# Patient Record
Sex: Female | Born: 1990
Health system: Southern US, Community
[De-identification: ages and names within clinical notes are randomized; demographics above are authoritative.]

## PROBLEM LIST (undated history)

## (undated) DIAGNOSIS — G4700 Insomnia, unspecified: Secondary | ICD-10-CM

## (undated) DIAGNOSIS — J45909 Unspecified asthma, uncomplicated: Secondary | ICD-10-CM

## (undated) DIAGNOSIS — T783XXA Angioneurotic edema, initial encounter: Secondary | ICD-10-CM

## (undated) DIAGNOSIS — F419 Anxiety disorder, unspecified: Secondary | ICD-10-CM

## (undated) DIAGNOSIS — T7840XA Allergy, unspecified, initial encounter: Secondary | ICD-10-CM

## (undated) DIAGNOSIS — F909 Attention-deficit hyperactivity disorder, unspecified type: Secondary | ICD-10-CM

## (undated) DIAGNOSIS — F319 Bipolar disorder, unspecified: Secondary | ICD-10-CM

## (undated) HISTORY — DX: Angioneurotic edema, initial encounter: T78.3XXA

## (undated) HISTORY — DX: Insomnia, unspecified: G47.00

## (undated) HISTORY — DX: Anxiety disorder, unspecified: F41.9

## (undated) HISTORY — DX: Bipolar disorder, unspecified: F31.9

## (undated) HISTORY — DX: Attention-deficit hyperactivity disorder, unspecified type: F90.9

## (undated) HISTORY — DX: Allergy, unspecified, initial encounter: T78.40XA

## (undated) HISTORY — DX: Unspecified asthma, uncomplicated: J45.909

## (undated) HISTORY — PX: NO PAST SURGERIES: SHX2092

---
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
## 2012-09-12 ENCOUNTER — Ambulatory Visit (HOSPITAL_COMMUNITY): Payer: Self-pay | Admitting: Psychiatry

## 2012-10-28 ENCOUNTER — Ambulatory Visit (HOSPITAL_COMMUNITY): Payer: Self-pay | Admitting: Psychiatry

## 2016-03-19 DIAGNOSIS — Z114 Encounter for screening for human immunodeficiency virus [HIV]: Secondary | ICD-10-CM | POA: Diagnosis not present

## 2016-03-19 DIAGNOSIS — R5381 Other malaise: Secondary | ICD-10-CM | POA: Diagnosis not present

## 2016-03-19 DIAGNOSIS — R0602 Shortness of breath: Secondary | ICD-10-CM | POA: Diagnosis not present

## 2016-03-19 DIAGNOSIS — F319 Bipolar disorder, unspecified: Secondary | ICD-10-CM | POA: Diagnosis not present

## 2016-03-19 DIAGNOSIS — Z79899 Other long term (current) drug therapy: Secondary | ICD-10-CM | POA: Diagnosis not present

## 2016-03-19 DIAGNOSIS — E559 Vitamin D deficiency, unspecified: Secondary | ICD-10-CM | POA: Diagnosis not present

## 2016-03-19 DIAGNOSIS — Z0000 Encounter for general adult medical examination without abnormal findings: Secondary | ICD-10-CM | POA: Diagnosis not present

## 2016-04-02 DIAGNOSIS — Z6831 Body mass index (BMI) 31.0-31.9, adult: Secondary | ICD-10-CM | POA: Diagnosis not present

## 2016-04-02 DIAGNOSIS — Z01419 Encounter for gynecological examination (general) (routine) without abnormal findings: Secondary | ICD-10-CM | POA: Diagnosis not present

## 2016-04-02 DIAGNOSIS — Z124 Encounter for screening for malignant neoplasm of cervix: Secondary | ICD-10-CM | POA: Diagnosis not present

## 2016-04-03 DIAGNOSIS — E559 Vitamin D deficiency, unspecified: Secondary | ICD-10-CM | POA: Diagnosis not present

## 2016-04-03 DIAGNOSIS — R0602 Shortness of breath: Secondary | ICD-10-CM | POA: Diagnosis not present

## 2016-04-03 DIAGNOSIS — F419 Anxiety disorder, unspecified: Secondary | ICD-10-CM | POA: Diagnosis not present

## 2016-04-03 DIAGNOSIS — R635 Abnormal weight gain: Secondary | ICD-10-CM | POA: Diagnosis not present

## 2016-04-17 DIAGNOSIS — E559 Vitamin D deficiency, unspecified: Secondary | ICD-10-CM | POA: Diagnosis not present

## 2016-04-17 DIAGNOSIS — Z79899 Other long term (current) drug therapy: Secondary | ICD-10-CM | POA: Diagnosis not present

## 2016-04-17 DIAGNOSIS — Z1389 Encounter for screening for other disorder: Secondary | ICD-10-CM | POA: Diagnosis not present

## 2016-04-17 DIAGNOSIS — R635 Abnormal weight gain: Secondary | ICD-10-CM | POA: Diagnosis not present

## 2016-04-20 DIAGNOSIS — N750 Cyst of Bartholin's gland: Secondary | ICD-10-CM | POA: Diagnosis not present

## 2016-04-20 DIAGNOSIS — Z6830 Body mass index (BMI) 30.0-30.9, adult: Secondary | ICD-10-CM | POA: Diagnosis not present

## 2016-05-03 DIAGNOSIS — Z6829 Body mass index (BMI) 29.0-29.9, adult: Secondary | ICD-10-CM | POA: Diagnosis not present

## 2016-05-03 DIAGNOSIS — N750 Cyst of Bartholin's gland: Secondary | ICD-10-CM | POA: Diagnosis not present

## 2016-05-17 DIAGNOSIS — N750 Cyst of Bartholin's gland: Secondary | ICD-10-CM | POA: Diagnosis not present

## 2016-05-18 DIAGNOSIS — F419 Anxiety disorder, unspecified: Secondary | ICD-10-CM | POA: Diagnosis not present

## 2016-05-18 DIAGNOSIS — R635 Abnormal weight gain: Secondary | ICD-10-CM | POA: Diagnosis not present

## 2016-05-18 DIAGNOSIS — J309 Allergic rhinitis, unspecified: Secondary | ICD-10-CM | POA: Diagnosis not present

## 2016-05-18 DIAGNOSIS — Z79899 Other long term (current) drug therapy: Secondary | ICD-10-CM | POA: Diagnosis not present

## 2016-06-06 DIAGNOSIS — J4540 Moderate persistent asthma, uncomplicated: Secondary | ICD-10-CM | POA: Diagnosis not present

## 2016-06-06 DIAGNOSIS — F319 Bipolar disorder, unspecified: Secondary | ICD-10-CM | POA: Diagnosis not present

## 2016-06-06 DIAGNOSIS — E559 Vitamin D deficiency, unspecified: Secondary | ICD-10-CM | POA: Diagnosis not present

## 2016-06-06 DIAGNOSIS — T7840XA Allergy, unspecified, initial encounter: Secondary | ICD-10-CM | POA: Diagnosis not present

## 2016-06-07 DIAGNOSIS — Z202 Contact with and (suspected) exposure to infections with a predominantly sexual mode of transmission: Secondary | ICD-10-CM | POA: Diagnosis not present

## 2016-06-07 DIAGNOSIS — Z6828 Body mass index (BMI) 28.0-28.9, adult: Secondary | ICD-10-CM | POA: Diagnosis not present

## 2016-06-11 DIAGNOSIS — Z23 Encounter for immunization: Secondary | ICD-10-CM | POA: Diagnosis not present

## 2016-06-11 DIAGNOSIS — F319 Bipolar disorder, unspecified: Secondary | ICD-10-CM | POA: Diagnosis not present

## 2016-06-11 DIAGNOSIS — J4541 Moderate persistent asthma with (acute) exacerbation: Secondary | ICD-10-CM | POA: Diagnosis not present

## 2016-06-11 DIAGNOSIS — J069 Acute upper respiratory infection, unspecified: Secondary | ICD-10-CM | POA: Diagnosis not present

## 2016-09-10 DIAGNOSIS — F310 Bipolar disorder, current episode hypomanic: Secondary | ICD-10-CM | POA: Diagnosis not present

## 2016-09-13 DIAGNOSIS — F4323 Adjustment disorder with mixed anxiety and depressed mood: Secondary | ICD-10-CM | POA: Diagnosis not present

## 2016-09-13 DIAGNOSIS — F3111 Bipolar disorder, current episode manic without psychotic features, mild: Secondary | ICD-10-CM | POA: Diagnosis not present

## 2016-09-20 DIAGNOSIS — F4323 Adjustment disorder with mixed anxiety and depressed mood: Secondary | ICD-10-CM | POA: Diagnosis not present

## 2016-09-27 DIAGNOSIS — F4323 Adjustment disorder with mixed anxiety and depressed mood: Secondary | ICD-10-CM | POA: Diagnosis not present

## 2016-10-02 DIAGNOSIS — F319 Bipolar disorder, unspecified: Secondary | ICD-10-CM | POA: Diagnosis not present

## 2016-11-01 DIAGNOSIS — Z113 Encounter for screening for infections with a predominantly sexual mode of transmission: Secondary | ICD-10-CM | POA: Diagnosis not present

## 2016-11-01 DIAGNOSIS — Z6828 Body mass index (BMI) 28.0-28.9, adult: Secondary | ICD-10-CM | POA: Diagnosis not present

## 2016-11-05 DIAGNOSIS — F3176 Bipolar disorder, in full remission, most recent episode depressed: Secondary | ICD-10-CM | POA: Diagnosis not present

## 2016-11-05 DIAGNOSIS — F900 Attention-deficit hyperactivity disorder, predominantly inattentive type: Secondary | ICD-10-CM | POA: Diagnosis not present

## 2016-11-22 DIAGNOSIS — Z30433 Encounter for removal and reinsertion of intrauterine contraceptive device: Secondary | ICD-10-CM | POA: Diagnosis not present

## 2016-11-22 DIAGNOSIS — Z3202 Encounter for pregnancy test, result negative: Secondary | ICD-10-CM | POA: Diagnosis not present

## 2016-11-22 DIAGNOSIS — R21 Rash and other nonspecific skin eruption: Secondary | ICD-10-CM | POA: Diagnosis not present

## 2016-11-22 DIAGNOSIS — Z6828 Body mass index (BMI) 28.0-28.9, adult: Secondary | ICD-10-CM | POA: Diagnosis not present

## 2016-11-22 DIAGNOSIS — F319 Bipolar disorder, unspecified: Secondary | ICD-10-CM | POA: Diagnosis not present

## 2016-11-22 DIAGNOSIS — E010 Iodine-deficiency related diffuse (endemic) goiter: Secondary | ICD-10-CM | POA: Diagnosis not present

## 2016-11-22 DIAGNOSIS — J4541 Moderate persistent asthma with (acute) exacerbation: Secondary | ICD-10-CM | POA: Diagnosis not present

## 2016-11-23 ENCOUNTER — Other Ambulatory Visit: Payer: Self-pay | Admitting: Family Medicine

## 2016-11-23 DIAGNOSIS — E010 Iodine-deficiency related diffuse (endemic) goiter: Secondary | ICD-10-CM

## 2016-11-29 DIAGNOSIS — L02229 Furuncle of trunk, unspecified: Secondary | ICD-10-CM | POA: Diagnosis not present

## 2016-11-29 DIAGNOSIS — B9689 Other specified bacterial agents as the cause of diseases classified elsewhere: Secondary | ICD-10-CM | POA: Diagnosis not present

## 2016-12-03 ENCOUNTER — Ambulatory Visit
Admission: RE | Admit: 2016-12-03 | Discharge: 2016-12-03 | Disposition: A | Discharge status: Home / Self Care | Payer: BLUE CROSS/BLUE SHIELD | Source: Ambulatory Visit | Attending: Family Medicine | Admitting: Family Medicine

## 2016-12-03 DIAGNOSIS — E010 Iodine-deficiency related diffuse (endemic) goiter: Secondary | ICD-10-CM | POA: Diagnosis not present

## 2016-12-07 DIAGNOSIS — F319 Bipolar disorder, unspecified: Secondary | ICD-10-CM | POA: Diagnosis not present

## 2016-12-07 DIAGNOSIS — J4541 Moderate persistent asthma with (acute) exacerbation: Secondary | ICD-10-CM | POA: Diagnosis not present

## 2016-12-07 DIAGNOSIS — J309 Allergic rhinitis, unspecified: Secondary | ICD-10-CM | POA: Diagnosis not present

## 2016-12-18 DIAGNOSIS — F4325 Adjustment disorder with mixed disturbance of emotions and conduct: Secondary | ICD-10-CM | POA: Diagnosis not present

## 2016-12-20 ENCOUNTER — Ambulatory Visit (INDEPENDENT_AMBULATORY_CARE_PROVIDER_SITE_OTHER): Payer: BLUE CROSS/BLUE SHIELD | Admitting: Allergy

## 2016-12-20 ENCOUNTER — Encounter: Payer: Self-pay | Admitting: Allergy

## 2016-12-20 VITALS — BP 100/64 | HR 99 | Temp 98.7°F | Resp 17 | Ht 64.0 in | Wt 163.2 lb

## 2016-12-20 DIAGNOSIS — L508 Other urticaria: Secondary | ICD-10-CM

## 2016-12-20 DIAGNOSIS — J4530 Mild persistent asthma, uncomplicated: Secondary | ICD-10-CM

## 2016-12-20 DIAGNOSIS — H1010 Acute atopic conjunctivitis, unspecified eye: Secondary | ICD-10-CM | POA: Diagnosis not present

## 2016-12-20 DIAGNOSIS — J309 Allergic rhinitis, unspecified: Secondary | ICD-10-CM

## 2016-12-20 MED ORDER — METHYLPREDNISOLONE ACETATE 80 MG/ML IJ SUSP
80.0000 mg | Freq: Once | INTRAMUSCULAR | Status: AC
Start: 2016-12-20 — End: 2016-12-20
  Administered 2016-12-20: 80 mg via INTRAMUSCULAR

## 2016-12-20 MED ORDER — RANITIDINE HCL 150 MG PO TABS: 150.0000 mg | ORAL_TABLET | Freq: Two times a day (BID) | ORAL | 2 refills | Status: DC

## 2016-12-20 MED ORDER — TRIAMCINOLONE ACETONIDE 0.1 % EX OINT: 1.0000 "application " | TOPICAL_OINTMENT | Freq: Two times a day (BID) | CUTANEOUS | 2 refills | Status: DC

## 2016-12-20 NOTE — Patient Instructions (Addendum)
Itchy rash  Rash of unclear etiology at this time.  Rash does appear to be Hives/urticaria but does not appear to be consistent with contact dermatitis or atopic dermatitis.  A prescription has been provided for triamcinolone 0.1% cream sparingly to affected areas twice daily as needed.  Also recommend taking your Allegra 1 tab twice a day as well as Zantac '150mg'$  twice a day.  Continue your singulair daily.    Instructions have been provided and discussed for H1/H2 receptor blockade with step-wise increase/decrease to find lowest effective dose (as above).  Will obtain labs to work-up for potential underlying cause: will get labs from Dr. Jodi Mourning office get additional -- tryptase, ESR, chronic hive panel, environmental panel  Asthma  Use Proair 2 puffs 15-20 minutes prior to activity and as needed every 4-6 hours for cough/wheeze/shortness of breath/chest tightness  Continue Flovent 2 puffs twice a day  Continue Singulair daily Asthma control goals:  Full participation in all desired activities (may need albuterol before activity) Albuterol use two time or less a week on average (not counting use with activity) Cough interfering with sleep two time or less a month Oral steroids no more than once a year No hospitalizations  Allergic rhinoconjunctivitis  Take allegra as above  Recommend nasal steroid spray like Flonase 1-2 sprays each nostril for nasal congestion/drainage.    Will obtain environmental allergen panel as above  Follow-up 2-3 months

## 2016-12-20 NOTE — Progress Notes (Signed)
New Patient Note  RE: Claudia Winters MRN: [id number] DOB: [birth year] Date of Office Visit: 12/20/2016  Referring provider: No ref. provider found Primary care provider: Vernie Shanks, MD  Chief Complaint: rash  History of present illness: Claudia Winters is a 26 y.o. female presenting today for evaluation of itchy rash.   She started getting a rash that would come and go over a couple hours and she reports the rash comes on anytime the doors at her job are opened to the outside.  She feels she is allergic to an outdoor allergen.   The rash now has been ongoing for about past month.  She reports the rash occurs daily.  She has the rash on her arms, back, chest, legs and feet.  Rash is extremely itchy and she reports she is miserable throughout the day due to the itch.  She scratches to the point of breaking the skin which then leaves scars.  No associated swelling, joint aches/pains.  She has been seen by her PCP and dermatologist which during those visits she reports she did not have any rash and thus nothing was done for her.  She states the dermatologist told her she had dry skin.  She denies any new foods, new medications, stings, changes in soaps/lotions/detergents.  Denies any preceding illnesses.  She reports she has always had sensitive skin and having 'reactions to outdoors'.  She reports she was taking up to 6 benadryls a day, topical benadryl, OTC hydrocortisone which has not been helping.       She has history of asthma and she does take flovent daily and takes Proair prior to her activity.   She takes singulair as well.  She denies any nighttime awakenings. She denies any oral steroid use in the past year any hospitalizations.    She has had allergy testing done in 2015 and recalls being positive for "dogwood trees" when she was living in [place].  She reports she would avoid the outdoors.  She was recently prescribed Allegra to use 1 tab daily for control of allergy symptoms during  changes of season. She reports she would get symptoms consistent with "sinus infection" for visit congestion and sinus pain and pressure. She also reports having ocular symptoms of itchy watery eyes.      Review of systems: Review of Systems  Constitutional: Negative for chills, fever and malaise/fatigue.  HENT: Negative for congestion, ear discharge, ear pain, nosebleeds, sinus pain, sore throat and tinnitus.   Eyes: Negative for discharge and redness.  Respiratory: Negative for cough, shortness of breath and wheezing.   Cardiovascular: Negative for chest pain.  Gastrointestinal: Negative for abdominal pain, diarrhea, heartburn, nausea and vomiting.  Musculoskeletal: Negative for joint pain and myalgias.  Skin: Positive for itching and rash.  Neurological: Negative for headaches.    All other systems negative unless noted above in HPI  Past medical history: Past Medical History:  Diagnosis Date  . Asthma     Past surgical history: Past Surgical History:  Procedure Laterality Date  . NO PAST SURGERIES      Family history:  Family History  Problem Relation Age of Onset  . Allergic rhinitis Neg Hx   . Angioedema Neg Hx   . Asthma Neg Hx   . Atopy Neg Hx   . Eczema Neg Hx   . Immunodeficiency Neg Hx   . Urticaria Neg Hx     Social history: She lives in a home with carpeting with electric heating  and central cooling. There is a dog and cat in the home. There is no concern for water damage, mildew or roaches in the home. She works as a Aeronautical engineer. She does smoke cigarettes socially.   Medication List: Allergies as of 12/20/2016   Not on File     Medication List       Accurate as of 12/20/16  7:20 PM. Always use your most recent med list.          ADDERALL XR 20 MG 24 hr capsule Generic drug:  amphetamine-dextroamphetamine Adderall XR 20 mg capsule,extended release   buPROPion 300 MG 24 hr tablet Commonly known as:  WELLBUTRIN XL bupropion HCl XL 300 mg  24 hr tablet, extended release   clonazePAM 1 MG tablet Commonly known as:  KLONOPIN clonazepam 1 mg tablet   FLOVENT HFA 110 MCG/ACT inhaler Generic drug:  fluticasone Flovent HFA 110 mcg/actuation aerosol inhaler   KYLEENA 19.5 MG Iud Generic drug:  Levonorgestrel Kyleena 17.5 mcg/24 hour (5 years) intrauterine device  Take 1 device by intrauterine route.   lithium carbonate 450 MG CR tablet Commonly known as:  ESKALITH lithium carbonate ER 450 mg tablet,extended release   ranitidine 150 MG tablet Commonly known as:  ZANTAC Take 1 tablet (150 mg total) by mouth 2 (two) times daily.   SINGULAIR 10 MG tablet Generic drug:  montelukast Singulair 10 mg tablet  Take 1 tablet every day by oral route.   triamcinolone ointment 0.1 % Commonly known as:  KENALOG Apply 1 application topically 2 (two) times daily.       Known medication allergies: Not on File   Physical examination: Blood pressure 100/64, pulse 99, temperature 98.7 F (37.1 C), temperature source Oral, resp. rate 17, height 5' 4" (1.626 m), weight 163 lb 3.2 oz (74 kg), SpO2 97 %.  General: Alert, interactive, in no acute distress. HEENT: PERRLA, TMs pearly gray, turbinates minimally edematous without discharge, post-pharynx non erythematous. Neck: Supple without lymphadenopathy. Lungs: Clear to auscultation without wheezing, rhonchi or rales. {no increased work of breathing. CV: Normal S1, S2 without murmurs. Abdomen: Nondistended, nontender. Skin: Scattered erythematous urticarial type lesions primarily located Arms bilaterally, chest, back, legs bilaterally.  Several lesions are excoriated , nonvesicular. Extremities:  No clubbing, cyanosis or edema. Neuro:   Grossly intact.  Diagnositics/Labs: Labs: She did have recent labs done by her PCP which she was able to pull up on her phone for review with TSH 1.78, T4 8 0.8 both normal. CMP was unremarkable and CBC unremarkable except for eosinophil of  8.4  Allergy testing: Deferred due to ongoing rash  Assessment and plan:   Itchy rash  Rash of unclear etiology at this time.  Rash does appear to be Hives/urticaria but does not appear to be consistent with contact dermatitis or atopic dermatitis.  A prescription has been provided for triamcinolone 0.1% cream sparingly to affected areas twice daily as needed.  Also recommend taking your Allegra 1 tab twice a day as well as Zantac 164m twice a day.  Continue your singulair daily.    Instructions have been provided and discussed for H1/H2 receptor blockade with step-wise increase/decrease to find lowest effective dose (as above).  Will obtain labs to work-up for potential underlying cause: will get labs from Dr. WJodi Mourningoffice --get additional -- tryptase, ESR, chronic hive panel, environmental panel  Asthma  Use Proair 2 puffs 15-20 minutes prior to activity and as needed every 4-6 hours for cough/wheeze/shortness of breath/chest tightness  Continue Flovent 2 puffs twice a day  Continue Singulair daily Asthma control goals:  Full participation in all desired activities (may need albuterol before activity) Albuterol use two time or less a week on average (not counting use with activity) Cough interfering with sleep two time or less a month Oral steroids no more than once a year No hospitalizations  Allergic rhinoconjunctivitis  Take allegra as above  Recommend nasal steroid spray like Flonase 1-2 sprays each nostril for nasal congestion/drainage.    Will obtain environmental allergen panel as above  Follow-up 2-3 months   I appreciate the opportunity to take part in [facility] care. Please do not hesitate to contact me with questions.  Sincerely,   Prudy Feeler, MD Allergy/Immunology [facility]

## 2016-12-21 LAB — CBC WITH DIFFERENTIAL/PLATELET
Basophils Absolute: 0 cells/uL (ref 0–200)
Basophils Relative: 0 %
Eosinophils Absolute: 427 cells/uL (ref 15–500)
Eosinophils Relative: 7 %
HCT: 39.1 % (ref 35.0–45.0)
Hemoglobin: 12.6 g/dL (ref 11.7–15.5)
Lymphocytes Relative: 43 %
Lymphs Abs: 2623 cells/uL (ref 850–3900)
MCH: 28.5 pg (ref 27.0–33.0)
MCHC: 32.2 g/dL (ref 32.0–36.0)
MCV: 88.5 fL (ref 80.0–100.0)
MPV: 10.2 fL (ref 7.5–12.5)
Monocytes Absolute: 366 cells/uL (ref 200–950)
Monocytes Relative: 6 %
Neutro Abs: 2684 cells/uL (ref 1500–7800)
Neutrophils Relative %: 44 %
Platelets: 329 10*3/uL (ref 140–400)
RBC: 4.42 MIL/uL (ref 3.80–5.10)
RDW: 14.8 % (ref 11.0–15.0)
WBC: 6.1 10*3/uL (ref 3.8–10.8)

## 2016-12-22 DIAGNOSIS — F900 Attention-deficit hyperactivity disorder, predominantly inattentive type: Secondary | ICD-10-CM | POA: Diagnosis not present

## 2016-12-22 DIAGNOSIS — F3174 Bipolar disorder, in full remission, most recent episode manic: Secondary | ICD-10-CM | POA: Diagnosis not present

## 2016-12-22 DIAGNOSIS — F3176 Bipolar disorder, in full remission, most recent episode depressed: Secondary | ICD-10-CM | POA: Diagnosis not present

## 2016-12-22 LAB — SEDIMENTATION RATE: Sed Rate: 1 mm/hr (ref 0–20)

## 2016-12-24 LAB — CP584 ZONE 3
Allergen, A. alternata, m6: 6.09 kU/L — ABNORMAL HIGH
Allergen, Black Locust, Acacia9: <0.1 kU/L
Allergen, C. Herbarum, M2: 0.91 kU/L — ABNORMAL HIGH
Allergen, Cedar tree, t12: 0.18 kU/L — ABNORMAL HIGH
Allergen, Comm Silver Birch, t9: <0.1 kU/L
Allergen, D pternoyssinus,d7: <0.1 kU/L
Allergen, Mucor Racemosus, M4: <0.1 kU/L
Allergen, Mulberry, t76: <0.1 kU/L
Allergen, Oak,t7: <0.1 kU/L
Allergen, P. notatum, m1: 0.78 kU/L — ABNORMAL HIGH
Allergen, S. Botryosum, m10: 1.84 kU/L — ABNORMAL HIGH
Aspergillus fumigatus, m3: 0.45 kU/L — ABNORMAL HIGH
Bahia Grass: <0.1 kU/L
Bermuda Grass: <0.1 kU/L
Box Elder IgE: <0.1 kU/L
Cat Dander: <0.1 kU/L
Cockroach: 0.14 kU/L — ABNORMAL HIGH
Common Ragweed: <0.1 kU/L
D. farinae: 0.15 kU/L — ABNORMAL HIGH
Dog Dander: 0.15 kU/L — ABNORMAL HIGH
Elm IgE: <0.1 kU/L
Johnson Grass: <0.1 kU/L
Meadow Grass: <0.1 kU/L
Nettle: 0.11 kU/L — ABNORMAL HIGH
Pecan/Hickory Tree IgE: 0.57 kU/L — ABNORMAL HIGH
Plantain: <0.1 kU/L
Rough Pigweed  IgE: 0.34 kU/L — ABNORMAL HIGH

## 2016-12-25 LAB — TRYPTASE: Tryptase: 7.3 ug/L (ref <11)

## 2016-12-26 LAB — CP CHRONIC URTICARIA INDEX PANEL
Histamine Release: <16 % (ref <16)
TSH: 0.42 mIU/L
Thyroglobulin Ab: 3 IU/mL — ABNORMAL HIGH (ref <2)
Thyroperoxidase Ab SerPl-aCnc: <1 IU/mL (ref <9)

## 2017-01-10 DIAGNOSIS — F4325 Adjustment disorder with mixed disturbance of emotions and conduct: Secondary | ICD-10-CM | POA: Diagnosis not present

## 2017-01-15 DIAGNOSIS — F4325 Adjustment disorder with mixed disturbance of emotions and conduct: Secondary | ICD-10-CM | POA: Diagnosis not present

## 2017-01-28 ENCOUNTER — Ambulatory Visit: Payer: Self-pay | Admitting: Allergy and Immunology

## 2017-03-05 DIAGNOSIS — F4325 Adjustment disorder with mixed disturbance of emotions and conduct: Secondary | ICD-10-CM | POA: Diagnosis not present

## 2017-03-19 DIAGNOSIS — F4325 Adjustment disorder with mixed disturbance of emotions and conduct: Secondary | ICD-10-CM | POA: Diagnosis not present

## 2017-03-28 DIAGNOSIS — F4325 Adjustment disorder with mixed disturbance of emotions and conduct: Secondary | ICD-10-CM | POA: Diagnosis not present

## 2017-04-04 ENCOUNTER — Ambulatory Visit: Payer: BLUE CROSS/BLUE SHIELD | Admitting: Allergy & Immunology

## 2017-04-04 DIAGNOSIS — F4325 Adjustment disorder with mixed disturbance of emotions and conduct: Secondary | ICD-10-CM | POA: Diagnosis not present

## 2017-04-05 DIAGNOSIS — Z113 Encounter for screening for infections with a predominantly sexual mode of transmission: Secondary | ICD-10-CM | POA: Diagnosis not present

## 2017-04-05 DIAGNOSIS — Z6827 Body mass index (BMI) 27.0-27.9, adult: Secondary | ICD-10-CM | POA: Diagnosis not present

## 2017-04-05 DIAGNOSIS — Z01419 Encounter for gynecological examination (general) (routine) without abnormal findings: Secondary | ICD-10-CM | POA: Diagnosis not present

## 2017-04-18 ENCOUNTER — Ambulatory Visit (INDEPENDENT_AMBULATORY_CARE_PROVIDER_SITE_OTHER): Payer: BLUE CROSS/BLUE SHIELD | Admitting: Allergy & Immunology

## 2017-04-18 ENCOUNTER — Encounter: Payer: Self-pay | Admitting: Allergy & Immunology

## 2017-04-18 ENCOUNTER — Other Ambulatory Visit: Payer: Self-pay | Admitting: *Deleted

## 2017-04-18 ENCOUNTER — Ambulatory Visit: Payer: BLUE CROSS/BLUE SHIELD | Admitting: Allergy & Immunology

## 2017-04-18 VITALS — BP 122/70 | HR 76 | Resp 16

## 2017-04-18 DIAGNOSIS — L508 Other urticaria: Secondary | ICD-10-CM

## 2017-04-18 DIAGNOSIS — J3089 Other allergic rhinitis: Secondary | ICD-10-CM

## 2017-04-18 DIAGNOSIS — J4520 Mild intermittent asthma, uncomplicated: Secondary | ICD-10-CM

## 2017-04-18 DIAGNOSIS — J302 Other seasonal allergic rhinitis: Secondary | ICD-10-CM

## 2017-04-18 DIAGNOSIS — F4325 Adjustment disorder with mixed disturbance of emotions and conduct: Secondary | ICD-10-CM | POA: Diagnosis not present

## 2017-04-18 MED ORDER — METHYLPREDNISOLONE ACETATE 40 MG/ML IJ SUSP
40.0000 mg | Freq: Once | INTRAMUSCULAR | Status: AC
  Administered 2017-04-18: 40 mg via INTRAMUSCULAR

## 2017-04-18 NOTE — Progress Notes (Signed)
FOLLOW UP  Date of Service/Encounter:  04/18/17   Assessment:   Seasonal and perennial allergic rhinitis (trees, weeds, molds, dust mites, dog)  Chronic urticaria  Mild persistent asthma - stable on Flovent  Plan/Recommendations:   1. Seasonal and perennial allergic rhinitis (trees, weeds, molds, dust mites, dog) - Avoidance measures provided.  - It does not seem that her allergic rhinitis symptoms are serious enough to warrant immunotherapy, but we did briefly discuss thing.   2. Chronic urticaria - Continue with Allegra twice daily. - Continue with Zantac twice daily. - Continue with Singulair '10mg'$  daily.  - Xolair consent signed.  - We were planning to give a sample today, but she said she did not have time to wait. - DepoMedrol '40mg'$  IM administered in clinic today.  3. Mild persistent asthma - Lung testing looks good today. - We will continue you on the Flovent twice daily for now. - Hopefully we can take this off once the Xolair is on board.  4. Return in about 2 months (around 06/18/2017).  Subjective:   Claudia Winters is a 26 y.o. female presenting today for follow up of  Chief Complaint  Patient presents with  . Pruritis    would like steriod shot    Claudia Winters has a history of the following: There are no active problems to display for this patient.   History obtained from: chart review and patient.  Claudia Winters Primary Care Provider is Vernie Shanks, MD.     Claudia Winters is a 26 y.o. female presenting for a follow up visit. She was last seen as a new patient in June 2018 by Dr. Nelva Bush. At that time, she was diagnosed with chronic urticaria. Her symptoms had been unresponsive to Benadryl 6 times daily, topical steroids, and topical benadryl. She had previous testing from [place] that was positive to trees, although she was not sure of the details of the testing. In any case, she was given a dose of DepoMedrol '80mg'$  IM and had labs performed that showed  sensitizations to dust mite, dog, cockroach, molds, tree and weeds. An ESR, tryptase, and CBC with differential were all normal, as was a chronic urticaria panel. She was started on suppressive dosing of antihistamines as well as Singulair.   Since the last visit, she has done well since the steroid injection and the increase in her antihistamines. She did receive avoidance measures following her blood work showed sensitizations to dust mites, dog, trees, weeds, and molds. She remains on her antihistamines as prescribed. 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 She will start scratching when the door to the hotel is opened.   In any case she has started itching severely. She was kick boxing the other day and then developed a prickly sensation over her hands after touching the matt. She is requesting a steroid injection today to halt the progression of symptoms. She did briefly hear about Xolair at the last visit and is interested in learning more today.   Claudia Winters's asthma has been well controlled. She has not required rescue medication, experienced nocturnal awakenings due to lower respiratory symptoms, nor have activities of daily living been limited. She has required no Emergency Department or Urgent Care visits for her asthma. She has required zero courses of systemic steroids for asthma exacerbations since the last visit. ACT score today is 25, indicating excellent asthma symptom control.   Otherwise, there have been no changes to her past medical history, surgical history,  family history, or social history.    Review of Systems: a 14-point review of systems is pertinent for what is mentioned in HPI.  Otherwise, all other systems were negative. Constitutional: negative other than that listed in the HPI Eyes: negative other than that listed in the HPI Ears, nose, mouth, throat, and face: negative other than that listed in the HPI Respiratory: negative other than that listed in the  HPI Cardiovascular: negative other than that listed in the HPI Gastrointestinal: negative other than that listed in the HPI Genitourinary: negative other than that listed in the HPI Integument: negative other than that listed in the HPI Hematologic: negative other than that listed in the HPI Musculoskeletal: negative other than that listed in the HPI Neurological: negative other than that listed in the HPI Allergy/Immunologic: negative other than that listed in the HPI    Objective:   Blood pressure 122/70, pulse 76, resp. rate 16. There is no height or weight on file to calculate BMI.   Physical Exam:  General: Alert, interactive, in no acute distress. Somewhat hazy and intermittently confused. Eyes: No conjunctival injection bilaterally, no discharge on the right, no discharge on the left and no Horner-Trantas dots present. PERRL bilaterally. EOMI without pain. No photophobia.  Ears: Right TM pearly gray with normal light reflex, Left TM pearly gray with normal light reflex, Right TM intact without perforation and Left TM intact without perforation.  Nose/Throat: External nose within normal limits and septum midline. Turbinates edematous with clear discharge. Posterior oropharynx erythematous without cobblestoning in the posterior oropharynx. Tonsils 2+ without exudates.  Tongue without thrush. Adenopathy: shoddy bilateral anterior cervical lymphadenopathy Lungs: Clear to auscultation without wheezing, rhonchi or rales. No increased work of breathing. CV: Normal S1/S2. No murmurs. Capillary refill <2 seconds.  Skin: Warm and dry, without lesions or rashes. Neuro:   Grossly intact. No focal deficits appreciated. Responsive to questions.  Diagnostic studies:   Spirometry: results normal (FEV1: 2.06/73%, FVC: 2.50/76%, FEV1/FVC: 82%).    Spirometry consistent with normal pattern.  Allergy Studies: none      Salvatore Marvel, MD [facility]

## 2017-04-18 NOTE — Patient Instructions (Addendum)
1. Seasonal and perennial allergic rhinitis (trees, weeds, molds, dust mites, dog) - Avoidance measures provided.    2. Chronic urticaria - Continue with Allegra twice daily. - Continue with Zantac twice daily. - Continue with Singulair 10mg  daily.   3. Mild persistent asthma - Lung testing looks good today. - We will continue you on the Flovent twice daily for now. - Hopefully we can take this off once the Xolair is on board.  4. Return in about 2 months (around 06/18/2017).    Please inform us of any Emergency Department visits, hospitalizations, or changes in symptoms. Call us before going to the ED for breathing or allergy symptoms since we might be able to fit you in for a sick visit. Feel free to contact us anytime with any questions, problems, or concerns.  It was a pleasure to meet you today! Enjoy the fall season!  Websites that have reliable patient information: 1. American Academy of Asthma, Allergy, and Immunology: www.aaaai.org 2. Food Allergy Research and Education (FARE): foodallergy.org 3. Mothers of Asthmatics: http://www.asthmacommunitynetwork.org 4. American College of Allergy, Asthma, and Immunology: www.acaai.org   Election Day is coming up on Tuesday, November 6th! Although it is too late to register to vote by mail, you can still register up to November 5th at any of the early voting locations. Try to early vote in case there are problems with your registration!   If you are turned away at the polls, you have the right to request a provisional ballot, which is required by law!      [facility] (open 8am - 5pm) First Floor [address], [place]   [place] (open 8am - 5pm) [address], [place]   [facility] (open 7am - 7pm)  [address], [place]   [facility] (open 7am - 7pm) [address], [place]   [place] (open 7am - 7pm) [address], [place]    [facility] (open 7am - 7pm) [address], [place]   [facility] (open 7am - 7pm) [address], [place]   [address] (open 7am - 7pm) [address], [place]   [facility] (open 7am - 7pm) [address],     Reducing Pollen Exposure  The [employer] Academy of Allergy, Asthma and Immunology suggests the following steps to reduce your exposure to pollen during allergy seasons.    1. Do not hang sheets or clothing out to dry; pollen may collect on these items. 2. Do not mow lawns or spend time around freshly cut grass; mowing stirs up pollen. 3. Keep windows closed at night.  Keep car windows closed while driving. 4. Minimize morning activities outdoors, a time when pollen counts are usually at their highest. 5. Stay indoors as much as possible when pollen counts or humidity is high and on windy days when pollen tends to remain in the air longer. 6. Use air conditioning when possible.  Many air conditioners have filters that trap the pollen spores. 7. Use a HEPA room air filter to remove pollen form the indoor air you breathe.  Control of Mold Allergen   Mold and fungi can grow on a variety of surfaces provided certain temperature and moisture conditions exist.  Outdoor molds grow on plants, decaying vegetation and soil.  The major outdoor mold, Alternaria and Cladosporium, are found in very high numbers during hot and dry conditions.  Generally, a late Summer - Fall peak is seen for common outdoor fungal spores.  Rain will temporarily lower outdoor mold spore count, but counts rise rapidly when the rainy period ends.  The most important indoor molds are Aspergillus and Penicillium.  Dark, humid and poorly ventilated basements are ideal sites for mold growth.  The next most common sites of mold growth are the bathroom and the kitchen.  Outdoor (Seasonal) Mold Control  Positive outdoor molds via skin  testing: Alternaria and Cladosporium  1. Use air conditioning and keep windows closed 2. Avoid exposure to decaying vegetation. 3. Avoid leaf raking. 4. Avoid grain handling. 5. Consider wearing a face mask if working in moldy areas.  6.   Indoor (Perennial) Mold Control   Positive indoor molds via skin testing: Aspergillus and Penicillium  1. Maintain humidity below 50%. 2. Clean washable surfaces with 5% bleach solution. 3. Remove sources e.g. contaminated carpets.     Control of House Dust Mite Allergen    House dust mites play a major role in allergic asthma and rhinitis.  They occur in environments with high humidity wherever human skin, the food for dust mites is found. High levels have been detected in dust obtained from mattresses, pillows, carpets, upholstered furniture, bed covers, clothes and soft toys.  The principal allergen of the house dust mite is found in its feces.  A gram of dust may contain 1,000 mites and 250,000 fecal particles.  Mite antigen is easily measured in the air during house cleaning activities.    1. Encase mattresses, including the box spring, and pillow, in an air tight cover.  Seal the zipper end of the encased mattresses with wide adhesive tape. 2. Wash the bedding in water of 130 degrees Farenheit weekly.  Avoid cotton comforters/quilts and flannel bedding: the most ideal bed covering is the dacron comforter. 3. Remove all upholstered furniture from the bedroom. 4. Remove carpets, carpet padding, rugs, and non-washable window drapes from the bedroom.  Wash drapes weekly or use plastic window coverings. 5. Remove all non-washable stuffed toys from the bedroom.  Wash stuffed toys weekly. 6. Have the room cleaned frequently with a vacuum cleaner and a damp dust-mop.  The patient should not be in a room which is being cleaned and should wait 1 hour after cleaning before going into the room. 7. Close and seal all heating outlets in the bedroom.   Otherwise, the room will become filled with dust-laden air.  An electric heater can be used to heat the room. 8. Reduce indoor humidity to less than 50%.  Do not use a humidifier.  Control of Dog or Cat Allergen  Avoidance is the best way to manage a dog or cat allergy. If you have a dog or cat and are allergic to dog or cats, consider removing the dog or cat from the home. If you have a dog or cat but don't want to find it a new home, or if your family wants a pet even though someone in the household is allergic, here are some strategies that may help keep symptoms at bay:  1. Keep the pet out of your bedroom and restrict it to only a few rooms. Be advised that keeping the dog or cat in only one room will not limit the allergens to that room. 2. Don't pet, hug or kiss the dog or cat; if you do, wash your hands with soap and water. 3. High-efficiency particulate air (HEPA) cleaners run continuously in a  bedroom or living room can reduce allergen levels over time. 4. Regular use of a high-efficiency vacuum cleaner or a central vacuum can reduce allergen levels. 5. Giving your dog or cat a bath at least once a week can reduce airborne allergen.

## 2017-04-19 ENCOUNTER — Ambulatory Visit (INDEPENDENT_AMBULATORY_CARE_PROVIDER_SITE_OTHER): Payer: BLUE CROSS/BLUE SHIELD

## 2017-04-19 DIAGNOSIS — L508 Other urticaria: Secondary | ICD-10-CM

## 2017-04-19 DIAGNOSIS — L501 Idiopathic urticaria: Secondary | ICD-10-CM

## 2017-04-19 MED ORDER — OMALIZUMAB 150 MG ~~LOC~~ SOLR
150.0000 mg | SUBCUTANEOUS | Status: DC
  Administered 2017-04-19: 150 mg via SUBCUTANEOUS

## 2017-04-19 NOTE — Addendum Note (Signed)
Addended by: Dub MikesHICKS, ASHLEY N on: 04/19/2017 09:06 AM   Modules accepted: Orders

## 2017-04-22 DIAGNOSIS — Z6827 Body mass index (BMI) 27.0-27.9, adult: Secondary | ICD-10-CM | POA: Diagnosis not present

## 2017-04-22 DIAGNOSIS — Z23 Encounter for immunization: Secondary | ICD-10-CM | POA: Diagnosis not present

## 2017-04-25 DIAGNOSIS — F4325 Adjustment disorder with mixed disturbance of emotions and conduct: Secondary | ICD-10-CM | POA: Diagnosis not present

## 2017-04-29 DIAGNOSIS — F900 Attention-deficit hyperactivity disorder, predominantly inattentive type: Secondary | ICD-10-CM | POA: Diagnosis not present

## 2017-04-29 DIAGNOSIS — F411 Generalized anxiety disorder: Secondary | ICD-10-CM | POA: Diagnosis not present

## 2017-04-29 DIAGNOSIS — F3342 Major depressive disorder, recurrent, in full remission: Secondary | ICD-10-CM | POA: Diagnosis not present

## 2017-05-02 DIAGNOSIS — F4325 Adjustment disorder with mixed disturbance of emotions and conduct: Secondary | ICD-10-CM | POA: Diagnosis not present

## 2017-05-09 DIAGNOSIS — F319 Bipolar disorder, unspecified: Secondary | ICD-10-CM | POA: Diagnosis not present

## 2017-05-09 DIAGNOSIS — J4541 Moderate persistent asthma with (acute) exacerbation: Secondary | ICD-10-CM | POA: Diagnosis not present

## 2017-05-09 DIAGNOSIS — Z7289 Other problems related to lifestyle: Secondary | ICD-10-CM | POA: Diagnosis not present

## 2017-05-09 DIAGNOSIS — L501 Idiopathic urticaria: Secondary | ICD-10-CM | POA: Diagnosis not present

## 2017-05-09 DIAGNOSIS — Z6826 Body mass index (BMI) 26.0-26.9, adult: Secondary | ICD-10-CM | POA: Diagnosis not present

## 2017-05-09 DIAGNOSIS — J309 Allergic rhinitis, unspecified: Secondary | ICD-10-CM | POA: Diagnosis not present

## 2017-05-09 DIAGNOSIS — Z23 Encounter for immunization: Secondary | ICD-10-CM | POA: Diagnosis not present

## 2017-05-09 DIAGNOSIS — Z113 Encounter for screening for infections with a predominantly sexual mode of transmission: Secondary | ICD-10-CM | POA: Diagnosis not present

## 2017-05-09 DIAGNOSIS — F4325 Adjustment disorder with mixed disturbance of emotions and conduct: Secondary | ICD-10-CM | POA: Diagnosis not present

## 2017-05-15 DIAGNOSIS — F4325 Adjustment disorder with mixed disturbance of emotions and conduct: Secondary | ICD-10-CM | POA: Diagnosis not present

## 2017-05-17 ENCOUNTER — Ambulatory Visit (INDEPENDENT_AMBULATORY_CARE_PROVIDER_SITE_OTHER): Payer: BLUE CROSS/BLUE SHIELD

## 2017-05-17 DIAGNOSIS — L501 Idiopathic urticaria: Secondary | ICD-10-CM | POA: Diagnosis not present

## 2017-05-17 DIAGNOSIS — L508 Other urticaria: Secondary | ICD-10-CM

## 2017-05-17 MED ORDER — OMALIZUMAB 150 MG ~~LOC~~ SOLR
300.0000 mg | SUBCUTANEOUS | Status: DC
  Administered 2017-05-17 – 2018-10-16 (×3): 300 mg via SUBCUTANEOUS

## 2017-05-21 DIAGNOSIS — F4325 Adjustment disorder with mixed disturbance of emotions and conduct: Secondary | ICD-10-CM | POA: Diagnosis not present

## 2017-05-30 DIAGNOSIS — F4325 Adjustment disorder with mixed disturbance of emotions and conduct: Secondary | ICD-10-CM | POA: Diagnosis not present

## 2017-06-06 DIAGNOSIS — L501 Idiopathic urticaria: Secondary | ICD-10-CM | POA: Diagnosis not present

## 2017-06-06 DIAGNOSIS — F4325 Adjustment disorder with mixed disturbance of emotions and conduct: Secondary | ICD-10-CM | POA: Diagnosis not present

## 2017-06-13 ENCOUNTER — Ambulatory Visit (INDEPENDENT_AMBULATORY_CARE_PROVIDER_SITE_OTHER): Payer: BLUE CROSS/BLUE SHIELD | Admitting: *Deleted

## 2017-06-13 DIAGNOSIS — L501 Idiopathic urticaria: Secondary | ICD-10-CM | POA: Diagnosis not present

## 2017-06-14 ENCOUNTER — Ambulatory Visit: Payer: Self-pay

## 2017-06-17 DIAGNOSIS — F4325 Adjustment disorder with mixed disturbance of emotions and conduct: Secondary | ICD-10-CM | POA: Diagnosis not present

## 2017-06-20 DIAGNOSIS — F4325 Adjustment disorder with mixed disturbance of emotions and conduct: Secondary | ICD-10-CM | POA: Diagnosis not present

## 2017-06-21 DIAGNOSIS — Z6826 Body mass index (BMI) 26.0-26.9, adult: Secondary | ICD-10-CM | POA: Diagnosis not present

## 2017-06-21 DIAGNOSIS — Z23 Encounter for immunization: Secondary | ICD-10-CM | POA: Diagnosis not present

## 2017-07-05 DIAGNOSIS — J0101 Acute recurrent maxillary sinusitis: Secondary | ICD-10-CM | POA: Diagnosis not present

## 2017-07-12 ENCOUNTER — Ambulatory Visit: Payer: BLUE CROSS/BLUE SHIELD

## 2017-07-15 ENCOUNTER — Ambulatory Visit: Payer: BLUE CROSS/BLUE SHIELD

## 2017-07-15 DIAGNOSIS — F4325 Adjustment disorder with mixed disturbance of emotions and conduct: Secondary | ICD-10-CM | POA: Diagnosis not present

## 2017-07-17 DIAGNOSIS — L501 Idiopathic urticaria: Secondary | ICD-10-CM | POA: Diagnosis not present

## 2017-08-05 ENCOUNTER — Ambulatory Visit: Payer: Self-pay

## 2017-08-14 DIAGNOSIS — Z113 Encounter for screening for infections with a predominantly sexual mode of transmission: Secondary | ICD-10-CM | POA: Diagnosis not present

## 2017-08-14 DIAGNOSIS — Z9189 Other specified personal risk factors, not elsewhere classified: Secondary | ICD-10-CM | POA: Diagnosis not present

## 2017-08-14 DIAGNOSIS — Z6828 Body mass index (BMI) 28.0-28.9, adult: Secondary | ICD-10-CM | POA: Diagnosis not present

## 2017-10-31 DIAGNOSIS — F3110 Bipolar disorder, current episode manic without psychotic features, unspecified: Secondary | ICD-10-CM | POA: Diagnosis not present

## 2017-10-31 DIAGNOSIS — Z113 Encounter for screening for infections with a predominantly sexual mode of transmission: Secondary | ICD-10-CM | POA: Diagnosis not present

## 2017-10-31 DIAGNOSIS — Z6828 Body mass index (BMI) 28.0-28.9, adult: Secondary | ICD-10-CM | POA: Diagnosis not present

## 2017-11-06 DIAGNOSIS — F3110 Bipolar disorder, current episode manic without psychotic features, unspecified: Secondary | ICD-10-CM | POA: Diagnosis not present

## 2017-11-11 DIAGNOSIS — F3110 Bipolar disorder, current episode manic without psychotic features, unspecified: Secondary | ICD-10-CM | POA: Diagnosis not present

## 2018-04-07 DIAGNOSIS — Z23 Encounter for immunization: Secondary | ICD-10-CM | POA: Diagnosis not present

## 2018-04-07 DIAGNOSIS — Z01419 Encounter for gynecological examination (general) (routine) without abnormal findings: Secondary | ICD-10-CM | POA: Diagnosis not present

## 2018-04-07 DIAGNOSIS — Z113 Encounter for screening for infections with a predominantly sexual mode of transmission: Secondary | ICD-10-CM | POA: Diagnosis not present

## 2018-04-07 DIAGNOSIS — Z6830 Body mass index (BMI) 30.0-30.9, adult: Secondary | ICD-10-CM | POA: Diagnosis not present

## 2018-05-20 DIAGNOSIS — J4540 Moderate persistent asthma, uncomplicated: Secondary | ICD-10-CM | POA: Diagnosis not present

## 2018-05-20 DIAGNOSIS — Z23 Encounter for immunization: Secondary | ICD-10-CM | POA: Diagnosis not present

## 2018-06-07 DIAGNOSIS — F3189 Other bipolar disorder: Secondary | ICD-10-CM | POA: Diagnosis not present

## 2018-06-07 DIAGNOSIS — F410 Panic disorder [episodic paroxysmal anxiety] without agoraphobia: Secondary | ICD-10-CM | POA: Diagnosis not present

## 2018-07-01 DIAGNOSIS — J4540 Moderate persistent asthma, uncomplicated: Secondary | ICD-10-CM | POA: Diagnosis not present

## 2018-08-21 DIAGNOSIS — J45909 Unspecified asthma, uncomplicated: Secondary | ICD-10-CM | POA: Diagnosis not present

## 2018-08-22 ENCOUNTER — Ambulatory Visit: Payer: BLUE CROSS/BLUE SHIELD | Admitting: Allergy

## 2018-08-27 ENCOUNTER — Encounter: Payer: Self-pay | Admitting: Allergy

## 2018-08-27 ENCOUNTER — Ambulatory Visit: Payer: BLUE CROSS/BLUE SHIELD | Admitting: Allergy

## 2018-08-27 VITALS — BP 122/86 | HR 102 | Resp 20 | Ht 64.5 in | Wt 195.2 lb

## 2018-08-27 DIAGNOSIS — L508 Other urticaria: Secondary | ICD-10-CM

## 2018-08-27 DIAGNOSIS — J302 Other seasonal allergic rhinitis: Secondary | ICD-10-CM

## 2018-08-27 DIAGNOSIS — J4541 Moderate persistent asthma with (acute) exacerbation: Secondary | ICD-10-CM

## 2018-08-27 DIAGNOSIS — Z113 Encounter for screening for infections with a predominantly sexual mode of transmission: Secondary | ICD-10-CM | POA: Diagnosis not present

## 2018-08-27 DIAGNOSIS — J3089 Other allergic rhinitis: Secondary | ICD-10-CM

## 2018-08-27 DIAGNOSIS — Z7251 High risk heterosexual behavior: Secondary | ICD-10-CM | POA: Diagnosis not present

## 2018-08-27 DIAGNOSIS — Z6832 Body mass index (BMI) 32.0-32.9, adult: Secondary | ICD-10-CM | POA: Diagnosis not present

## 2018-08-27 MED ORDER — BUDESONIDE-FORMOTEROL FUMARATE 160-4.5 MCG/ACT IN AERO: 2.0000 | INHALATION_SPRAY | Freq: Two times a day (BID) | RESPIRATORY_TRACT | 5 refills | Status: DC

## 2018-08-27 NOTE — Progress Notes (Signed)
Follow-up Note  RE: Claudia Winters MRN: [id number] DOB: [birth year] Date of Office Visit: 08/27/2018   History of present illness: Claudia Winters is a 28 y.o. female presenting today for sick visit/follow-up.  She was last seen in the office on April 18, 2017 by Dr. Dellis Anes.  She has a history of allergic rhinitis, chronic urticaria and asthma.  She states that since about October 2019 she has had on and off symptoms with her asthma and states that she has had several different infections including sinus infection.  She states she has been to her PCP on several occasions for the symptoms but states that she was not provided with any antibiotics until last week.  She states she has been having symptoms of sinus headache, sensitivity to light, throat fullness, shortness of breath, nasal congestion.  She states that she is currently on amoxicillin 10-day course.  She states that she has been needing to use her albuterol relatively recently to almost daily for the shortness of breath.  She is on Flovent as well.  She states that she is using both of the inhaler since she has the Flovent and the albuterol 2 times a day.  She otherwise states that she has not had any significant asthma issues up until this past fall.  She denies any oral steroids in the past 2 years for her asthma.  She had been doing well on Flovent. She states that this time she would like to resume use of Xolair but this time to use for her allergic asthma symptoms.  With her history of urticaria she had been on Xolair but she stopped this.  She states that she has not had any recurrence of her hives in over a year.  She states around November 2018 she stopped all of her meds during a bipolar episode.  She states since she had no return of her hives she never restarted her antihistamines.  However her Allegra and Singulair were started this fall when she developed more sinus and respiratory problems.  Review of systems: Review of  Systems  Constitutional: Negative for chills, fever and malaise/fatigue.  HENT: Positive for congestion and sinus pain. Negative for ear discharge, ear pain, nosebleeds and sore throat.   Eyes: Negative for pain, discharge and redness.  Respiratory: Positive for cough and shortness of breath.   Cardiovascular: Negative for chest pain.  Gastrointestinal: Negative for abdominal pain, constipation, diarrhea, heartburn, nausea and vomiting.  Musculoskeletal: Negative for joint pain.  Skin: Negative for itching and rash.  Neurological: Positive for headaches. Negative for dizziness.    All other systems negative unless noted above in HPI  Past medical/social/surgical/family history have been reviewed and are unchanged unless specifically indicated below.  No changes  Medication List: Allergies as of 08/27/2018   No Known Allergies     Medication List       Accurate as of August 27, 2018  5:28 PM. Always use your most recent med list.        ADDERALL XR 20 MG 24 hr capsule Generic drug:  amphetamine-dextroamphetamine Adderall XR 20 mg capsule,extended release   amoxicillin 500 MG capsule Commonly known as:  AMOXIL Take 1,000 mg by mouth 2 (two) times daily.   budesonide-formoterol 160-4.5 MCG/ACT inhaler Commonly known as:  SYMBICORT Inhale 2 puffs into the lungs 2 (two) times daily.   buPROPion 150 MG 24 hr tablet Commonly known as:  WELLBUTRIN XL   busPIRone 30 MG tablet Commonly known as:  BUSPAR  clonazePAM 1 MG tablet Commonly known as:  KLONOPIN clonazepam 1 mg tablet   fexofenadine 180 MG tablet Commonly known as:  ALLEGRA Take 180 mg by mouth daily.   KYLEENA 19.5 MG Iud Generic drug:  Levonorgestrel Kyleena 17.5 mcg/24 hour (5 years) intrauterine device  Take 1 device by intrauterine route.   PROAIR HFA 108 (90 Base) MCG/ACT inhaler Generic drug:  albuterol Inhale into the lungs every 6 (six) hours as needed for wheezing or shortness of breath.     SINGULAIR 10 MG tablet Generic drug:  montelukast Singulair 10 mg tablet  Take 1 tablet every day by oral route.   VRAYLAR 1.5 & 3 MG Cppk Generic drug:  Cariprazine HCl Take by mouth.       Known medication allergies: No Known Allergies   Physical examination: Blood pressure 122/86, pulse (!) 102, resp. rate 20, height 5' 4.5" (1.638 m), weight 195 lb 3.2 oz (88.5 kg).  General: Alert, interactive, in no acute distress. HEENT: PERRLA, TMs pearly gray, turbinates moderately edematous without discharge, post-pharynx non erythematous. Neck: Supple without lymphadenopathy. Lungs: Decreased breath sounds with expiratory wheezing bilaterally. {no increased work of breathing.   CV: Normal S1, S2 without murmurs. Abdomen: Nondistended, nontender. Skin: Warm and dry, without lesions or rashes. Extremities:  No clubbing, cyanosis or edema. Neuro:   Grossly intact.  Diagnositics/Labs:  Spirometry: FEV1: 1.35L 47%, FVC: 1.7L 51% predicted.  Status post bronchodilator she had a 31% improvement in her FEV1 to 1.77 L or 61% predicted which is significant.  FVC also improved by 34% to 2.27 L or 60% predicted.  Assessment and plan:   Mod persistent asthma - with current exacerbation - lung testing is very low today due to exacerbation - continue Singulair 10mg  daily - take at bedtime - stop Flovent  - start Symbicort 160mg  2 puffs twice a day (grey and red inhaler) - have access to albuterol inhaler 2 puffs every 4-6 hours as needed for cough/wheeze/shortness of breath/chest tightness.  May use 15-20 minutes prior to activity.   Monitor frequency of use.   - will obtain environmental panel with IgE in anticipation of starting Xolair for control of allergic asthma.   - finish out your Amoxicillin course - take prednisone 20mg  twice a day x 3 days then 20mg  x 1 day, then 10mg  x 1 day and stop  Seasonal and perennial allergic rhinitis (trees, weeds, molds, dust mites, dog) - Continue  avoidance measures  - Continue with Allegra 180mg  daily (may take additional dose if needed) - Continue with Singulair 10mg  daily.   Chronic urticaria - improved and has not had further outbreak - if hives return resume regimen of Allegra twice a day with Zantac twice a day  Return in about 3-4 months or sooner if needed  I appreciate the opportunity to take part in [place] care. Please do not hesitate to contact me with questions.  Sincerely,   Margo Aye, MD Allergy/Immunology [facility]

## 2018-08-27 NOTE — Patient Instructions (Addendum)
Mod persistent asthma - with current exacerbation - lung testing is very low today due to exacerbation - continue Singulair 10mg  daily - take at bedtime - stop Flovent  - start Symbicort 160mg  2 puffs twice a day (grey and red inhaler) - have access to albuterol inhaler 2 puffs every 4-6 hours as needed for cough/wheeze/shortness of breath/chest tightness.  May use 15-20 minutes prior to activity.   Monitor frequency of use.   - will obtain environmental panel with IgE in anticipation of starting Xolair for control of allergic asthma.   - finish out your Amoxicillin course - take prednisone 20mg  twice a day x 3 days then 20mg  x 1 day, then 10mg  x 1 day and stop  Seasonal and perennial allergic rhinitis (trees, weeds, molds, dust mites, dog) - Continue avoidance measures  - Continue with Allegra 180mg  daily (may take additional dose if needed) - Continue with Singulair 10mg  daily.   Chronic urticaria - improved and has not had further outbreak - if hives return resume regimen of Allegra twice a day with Zantac twice a day  Return in about 3-4 months or sooner if needed

## 2018-08-28 NOTE — Addendum Note (Signed)
Addended by: Mliss Fritz I on: 08/28/2018 07:39 AM   Modules accepted: Orders

## 2018-08-30 LAB — ALLERGENS W/TOTAL IGE AREA 2
Alternaria Alternata IgE: 4.66 kU/L — AB
Aspergillus Fumigatus IgE: 0.26 kU/L — AB
Bermuda Grass IgE: <0.1 kU/L
Cedar, Mountain IgE: <0.1 kU/L
Cladosporium Herbarum IgE: 0.41 kU/L — AB
Cockroach, German IgE: <0.1 kU/L
Common Silver Birch IgE: <0.1 kU/L
Cottonwood IgE: <0.1 kU/L
D Farinae IgE: <0.1 kU/L
D001-IGE D PTERONYSSINUS: 0.1 kU/L — AB
Dog Dander IgE: 0.7 kU/L — AB
Elm, American IgE: <0.1 kU/L
IgE (Immunoglobulin E), Serum: 68 IU/mL (ref 6–495)
Johnson Grass IgE: <0.1 kU/L
Maple/Box Elder IgE: <0.1 kU/L
Oak, White IgE: <0.1 kU/L
PECAN, HICKORY IGE: 0.2 kU/L — AB
Penicillium Chrysogen IgE: 0.26 kU/L — AB
Ragweed, Short IgE: <0.1 kU/L
W014-IGE PIGWEED, ROUGH: 0.13 kU/L — AB
White Mulberry IgE: <0.1 kU/L

## 2018-09-23 DIAGNOSIS — Z30430 Encounter for insertion of intrauterine contraceptive device: Secondary | ICD-10-CM | POA: Diagnosis not present

## 2018-09-23 DIAGNOSIS — J4540 Moderate persistent asthma, uncomplicated: Secondary | ICD-10-CM | POA: Diagnosis not present

## 2018-09-24 DIAGNOSIS — F900 Attention-deficit hyperactivity disorder, predominantly inattentive type: Secondary | ICD-10-CM | POA: Diagnosis not present

## 2018-09-24 DIAGNOSIS — F410 Panic disorder [episodic paroxysmal anxiety] without agoraphobia: Secondary | ICD-10-CM | POA: Diagnosis not present

## 2018-09-24 DIAGNOSIS — F6381 Intermittent explosive disorder: Secondary | ICD-10-CM | POA: Diagnosis not present

## 2018-09-24 DIAGNOSIS — F3131 Bipolar disorder, current episode depressed, mild: Secondary | ICD-10-CM | POA: Diagnosis not present

## 2018-09-30 ENCOUNTER — Encounter: Payer: Self-pay | Admitting: *Deleted

## 2018-10-16 ENCOUNTER — Ambulatory Visit (INDEPENDENT_AMBULATORY_CARE_PROVIDER_SITE_OTHER): Payer: BLUE CROSS/BLUE SHIELD | Admitting: *Deleted

## 2018-10-16 ENCOUNTER — Other Ambulatory Visit: Payer: Self-pay | Admitting: *Deleted

## 2018-10-16 DIAGNOSIS — L501 Idiopathic urticaria: Secondary | ICD-10-CM

## 2018-10-16 MED ORDER — EPINEPHRINE 0.3 MG/0.3ML IJ SOAJ: 0.3000 mg | INTRAMUSCULAR | 1 refills | Status: DC | PRN

## 2018-10-16 MED ORDER — EPINEPHRINE 0.3 MG/0.3ML IJ SOAJ: 0.3000 mg | Freq: Once | INTRAMUSCULAR | 1 refills | Status: AC

## 2018-11-13 ENCOUNTER — Ambulatory Visit: Payer: BLUE CROSS/BLUE SHIELD

## 2018-12-17 ENCOUNTER — Ambulatory Visit: Payer: BLUE CROSS/BLUE SHIELD | Admitting: Allergy

## 2019-03-22 ENCOUNTER — Other Ambulatory Visit: Payer: Self-pay

## 2019-03-22 ENCOUNTER — Inpatient Hospital Stay (HOSPITAL_COMMUNITY)
Admission: RE | Admit: 2019-03-22 | Discharge: 2019-03-24 | DRG: 885 | Disposition: A | Discharge status: Home / Self Care | Payer: No Typology Code available for payment source | Attending: Psychiatry | Admitting: Psychiatry

## 2019-03-22 ENCOUNTER — Encounter (HOSPITAL_COMMUNITY): Payer: Self-pay | Admitting: Emergency Medicine

## 2019-03-22 DIAGNOSIS — G4700 Insomnia, unspecified: Secondary | ICD-10-CM | POA: Diagnosis present

## 2019-03-22 DIAGNOSIS — R45851 Suicidal ideations: Secondary | ICD-10-CM | POA: Diagnosis present

## 2019-03-22 DIAGNOSIS — F314 Bipolar disorder, current episode depressed, severe, without psychotic features: Secondary | ICD-10-CM | POA: Diagnosis present

## 2019-03-22 DIAGNOSIS — F319 Bipolar disorder, unspecified: Secondary | ICD-10-CM | POA: Diagnosis present

## 2019-03-22 DIAGNOSIS — F17210 Nicotine dependence, cigarettes, uncomplicated: Secondary | ICD-10-CM | POA: Diagnosis present

## 2019-03-22 DIAGNOSIS — J45909 Unspecified asthma, uncomplicated: Secondary | ICD-10-CM | POA: Diagnosis present

## 2019-03-22 DIAGNOSIS — Z20828 Contact with and (suspected) exposure to other viral communicable diseases: Secondary | ICD-10-CM | POA: Diagnosis present

## 2019-03-22 LAB — PREGNANCY, URINE: Preg Test, Ur: NEGATIVE

## 2019-03-22 LAB — URINALYSIS, COMPLETE (UACMP) WITH MICROSCOPIC
Bacteria, UA: NONE SEEN
Bilirubin Urine: NEGATIVE
Glucose, UA: NEGATIVE mg/dL
Hgb urine dipstick: NEGATIVE
Ketones, ur: 80 mg/dL — AB
Nitrite: NEGATIVE
Protein, ur: 30 mg/dL — AB
Specific Gravity, Urine: 1.03 (ref 1.005–1.030)
pH: 6 (ref 5.0–8.0)

## 2019-03-22 LAB — COMPREHENSIVE METABOLIC PANEL
ALT: 108 U/L — ABNORMAL HIGH (ref 0–44)
AST: 84 U/L — ABNORMAL HIGH (ref 15–41)
Albumin: 4.6 g/dL (ref 3.5–5.0)
Alkaline Phosphatase: 57 U/L (ref 38–126)
Anion gap: 12 (ref 5–15)
BUN: 7 mg/dL (ref 6–20)
CO2: 23 mmol/L (ref 22–32)
Calcium: 9.2 mg/dL (ref 8.9–10.3)
Chloride: 103 mmol/L (ref 98–111)
Creatinine, Ser: 0.9 mg/dL (ref 0.44–1.00)
GFR calc Af Amer: >60 mL/min (ref >60)
GFR calc non Af Amer: >60 mL/min (ref >60)
Glucose, Bld: 96 mg/dL (ref 70–99)
Potassium: 3.4 mmol/L — ABNORMAL LOW (ref 3.5–5.1)
Sodium: 138 mmol/L (ref 135–145)
Total Bilirubin: 0.6 mg/dL (ref 0.3–1.2)
Total Protein: 7.6 g/dL (ref 6.5–8.1)

## 2019-03-22 LAB — RAPID URINE DRUG SCREEN, HOSP PERFORMED
Amphetamines: NOT DETECTED
Barbiturates: NOT DETECTED
Benzodiazepines: NOT DETECTED
Cocaine: NOT DETECTED
Opiates: NOT DETECTED
Tetrahydrocannabinol: POSITIVE — AB

## 2019-03-22 LAB — TSH: TSH: 0.723 u[IU]/mL (ref 0.350–4.500)

## 2019-03-22 LAB — CBC
HCT: 43.1 % (ref 36.0–46.0)
Hemoglobin: 13.6 g/dL (ref 12.0–15.0)
MCH: 28.9 pg (ref 26.0–34.0)
MCHC: 31.6 g/dL (ref 30.0–36.0)
MCV: 91.7 fL (ref 80.0–100.0)
Platelets: 284 10*3/uL (ref 150–400)
RBC: 4.7 MIL/uL (ref 3.87–5.11)
RDW: 14.9 % (ref 11.5–15.5)
WBC: 6.5 10*3/uL (ref 4.0–10.5)
nRBC: 0 % (ref 0.0–0.2)

## 2019-03-22 LAB — LIPID PANEL
Cholesterol: 172 mg/dL (ref 0–200)
HDL: 65 mg/dL (ref >40)
LDL Cholesterol: 82 mg/dL (ref 0–99)
Total CHOL/HDL Ratio: 2.6 RATIO
Triglycerides: 123 mg/dL (ref <150)
VLDL: 25 mg/dL (ref 0–40)

## 2019-03-22 LAB — ETHANOL: Alcohol, Ethyl (B): <10 mg/dL (ref <10)

## 2019-03-22 LAB — SARS CORONAVIRUS 2 BY RT PCR (HOSPITAL ORDER, PERFORMED IN ~~LOC~~ HOSPITAL LAB): SARS Coronavirus 2: NEGATIVE

## 2019-03-22 MED ORDER — ALBUTEROL SULFATE HFA 108 (90 BASE) MCG/ACT IN AERS
2.0000 | INHALATION_SPRAY | Freq: Four times a day (QID) | RESPIRATORY_TRACT | Status: DC | PRN
  Administered 2019-03-22 – 2019-03-24 (×4): 2 via RESPIRATORY_TRACT
  Filled 2019-03-22: qty 6.7

## 2019-03-22 MED ORDER — ALUM & MAG HYDROXIDE-SIMETH 200-200-20 MG/5ML PO SUSP: 30.0000 mL | ORAL | Status: DC | PRN

## 2019-03-22 MED ORDER — MOMETASONE FURO-FORMOTEROL FUM 100-5 MCG/ACT IN AERO
2.0000 | INHALATION_SPRAY | Freq: Two times a day (BID) | RESPIRATORY_TRACT | Status: DC
  Administered 2019-03-22 – 2019-03-24 (×4): 2 via RESPIRATORY_TRACT
  Filled 2019-03-22 (×2): qty 8.8

## 2019-03-22 MED ORDER — MAGNESIUM HYDROXIDE 400 MG/5ML PO SUSP: 30.0000 mL | Freq: Every day | ORAL | Status: DC | PRN

## 2019-03-22 MED ORDER — ACETAMINOPHEN 325 MG PO TABS: 650.0000 mg | ORAL_TABLET | Freq: Four times a day (QID) | ORAL | Status: DC | PRN

## 2019-03-22 MED ORDER — HYDROXYZINE HCL 25 MG PO TABS: 25.0000 mg | ORAL_TABLET | Freq: Three times a day (TID) | ORAL | Status: DC | PRN

## 2019-03-22 MED ORDER — DIPHENHYDRAMINE HCL 25 MG PO CAPS: 25.0000 mg | ORAL_CAPSULE | Freq: Four times a day (QID) | ORAL | Status: DC | PRN

## 2019-03-22 NOTE — H&P (Signed)
Behavioral Health Medical Screening Exam  Claudia Winters is an 28 y.o. female with suicidal thoughts and polysubstance abuse. She presents with increased anxiety, depression, and suicidal thoughts. She is very guarded and not forthcoming. She makes no eye contact with Probation officer, and is demonstrating an increased amount of psychomotor agitation. She endorses some suicidal thoughts by way of nodding her head.   Total Time spent with patient: 30 minutes  Psychiatric Specialty Exam: Physical Exam  ROS  There were no vitals taken for this visit.There is no height or weight on file to calculate BMI.  General Appearance: Fairly Groomed  Eye Contact:  Fair  Speech:  Clear and Coherent and Normal Rate  Volume:  Normal  Mood:  Anxious and Worthless  Affect:  Congruent and Full Range  Thought Process:  Coherent, Linear and Descriptions of Associations: Intact  Orientation:  Full (Time, Place, and Person)  Thought Content:  Logical, Rumination and Tangential  Suicidal Thoughts:  Yes.  with intent/plan  Homicidal Thoughts:  No  Memory:  Immediate;   Fair Recent;   Good  Judgement:  Fair  Insight:  Shallow  Psychomotor Activity:  Increased and psychomotor agitation  Concentration: Concentration: Fair and Attention Span: Fair  Recall:  [place]: Good  Akathisia:  No  Handed:  Right  AIMS (if indicated):     Assets:  Communication Skills Desire for Improvement Financial Resources/Insurance Leisure Time Physical Health Social Support Transportation Vocational/Educational  Sleep:       Musculoskeletal: Strength & Muscle Tone: within normal limits Gait & Station: normal Patient leans: N/A  There were no vitals taken for this visit.  Recommendations:  Based on my evaluation the patient does not appear to have an emergency medical condition. will recommend inpatient admission, pending covid screening.   Suella Broad, FNP 03/22/2019, 1:10 PM
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
## 2019-03-22 NOTE — Progress Notes (Signed)
Pt was admitted to the adult unit by Mateo Flow, RN., from the observation unit. Mateo Flow, RN., searched the pt, performed a skin assessment (self inflicted superficial cuts noted on the pt's left forearm), and v/s assessed by Pamala Hurry, RN. The pt denied active SI per Mateo Flow, RN. The pt was brought onto the unit safely and oriented to the environment. 15 minute checks performed for safety.

## 2019-03-23 DIAGNOSIS — F314 Bipolar disorder, current episode depressed, severe, without psychotic features: Secondary | ICD-10-CM

## 2019-03-23 LAB — HEMOGLOBIN A1C
Hgb A1c MFr Bld: 5.6 % (ref 4.8–5.6)
Mean Plasma Glucose: 114.02 mg/dL

## 2019-03-23 MED ORDER — ZIPRASIDONE MESYLATE 20 MG IM SOLR: 10.0000 mg | INTRAMUSCULAR | Status: DC | PRN

## 2019-03-23 MED ORDER — LORAZEPAM 1 MG PO TABS: 1.0000 mg | ORAL_TABLET | Freq: Four times a day (QID) | ORAL | Status: DC | PRN

## 2019-03-23 MED ORDER — ONDANSETRON 4 MG PO TBDP: 4.0000 mg | ORAL_TABLET | Freq: Four times a day (QID) | ORAL | Status: DC | PRN

## 2019-03-23 MED ORDER — OLANZAPINE 5 MG PO TBDP: 5.0000 mg | ORAL_TABLET | Freq: Three times a day (TID) | ORAL | Status: DC | PRN

## 2019-03-23 MED ORDER — VITAMIN B-1 100 MG PO TABS
100.0000 mg | ORAL_TABLET | Freq: Every day | ORAL | Status: DC
  Administered 2019-03-24: 100 mg via ORAL
  Filled 2019-03-23 (×3): qty 1

## 2019-03-23 MED ORDER — THIAMINE HCL 100 MG/ML IJ SOLN
100.0000 mg | Freq: Once | INTRAMUSCULAR | Status: DC
  Filled 2019-03-23: qty 2

## 2019-03-23 MED ORDER — LORAZEPAM 1 MG PO TABS: 1.0000 mg | ORAL_TABLET | ORAL | Status: DC | PRN

## 2019-03-23 MED ORDER — ADULT MULTIVITAMIN W/MINERALS CH
1.0000 | ORAL_TABLET | Freq: Every day | ORAL | Status: DC
  Administered 2019-03-24: 1 via ORAL
  Filled 2019-03-23 (×4): qty 1

## 2019-03-23 MED ORDER — TRAZODONE HCL 50 MG PO TABS
50.0000 mg | ORAL_TABLET | Freq: Every evening | ORAL | Status: DC | PRN
  Filled 2019-03-23: qty 7

## 2019-03-23 MED ORDER — CARIPRAZINE HCL 1.5 MG PO CAPS
1.5000 mg | ORAL_CAPSULE | Freq: Every day | ORAL | Status: DC
  Administered 2019-03-24: 1.5 mg via ORAL
  Filled 2019-03-23 (×3): qty 1
  Filled 2019-03-23: qty 7

## 2019-03-23 MED ORDER — ZIPRASIDONE MESYLATE 20 MG IM SOLR: 20.0000 mg | INTRAMUSCULAR | Status: DC | PRN

## 2019-03-23 MED ORDER — LOPERAMIDE HCL 2 MG PO CAPS: 2.0000 mg | ORAL_CAPSULE | ORAL | Status: DC | PRN

## 2019-03-23 MED ORDER — DIPHENHYDRAMINE HCL 25 MG PO CAPS: 25.0000 mg | ORAL_CAPSULE | Freq: Three times a day (TID) | ORAL | Status: DC | PRN

## 2019-03-23 NOTE — Plan of Care (Signed)
Nurse discussed patient's irritability and anger with her.  Talked about coping skills.
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
## 2019-03-23 NOTE — Progress Notes (Signed)
DAR NOTE: Pt present with flat affect and depressed mood in the unit. Pt has been irritable and uncooperative, pt stated she has not been getting what she want and therefore she will take what she is been given. Pt complained of her inhaler scheduled being messed up and waiting to scream at the doctor in the morning.  Pt denies physical pain, took all her inhaler as scheduled. Pt's safety ensured with 15 minute and environmental checks. Pt currently denies SI/HI and A/V hallucinations. Pt verbally agrees to seek staff if SI/HI or A/VH occurs and to consult with staff before acting on these thoughts. Will continue POC.

## 2019-03-23 NOTE — Progress Notes (Signed)
Patient did not attend wrap up group. 

## 2019-03-23 NOTE — Progress Notes (Signed)
Patient came to medication window requesting her inhaler.  She voiced no other complaints. Writer spoke with her briefly because patient was very irritated and just  wanted to return to her room and rest, which she did. Safety maintained on unit with 15 min checks.
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
## 2019-03-23 NOTE — BHH Suicide Risk Assessment (Addendum)
[facility] Admission Suicide Risk Assessment   Nursing information obtained from:  Patient Demographic factors:  Adolescent or young adult Current Mental Status:  Suicidal ideation indicated by patient, Suicide plan, Self-harm thoughts, Self-harm behaviors Loss Factors:  NA Historical Factors:  Prior suicide attempts Risk Reduction Factors:  Living with another person, especially a relative  Total Time spent with patient: 45 minutes Principal Problem: Bipolar I disorder with depression, severe (HCC) Diagnosis:  Principal Problem:   Bipolar I disorder with depression, severe (HCC)  Subjective Data:   Continued Clinical Symptoms:  Alcohol Use Disorder Identification Test Final Score (AUDIT): 10 The "Alcohol Use Disorders Identification Test", Guidelines for Use in Primary Care, Second Edition.  World Science writer [facility]). Score between 0-7:  no or low risk or alcohol related problems. Score between 8-15:  moderate risk of alcohol related problems. Score between 16-19:  high risk of alcohol related problems. Score 20 or above:  warrants further diagnostic evaluation for alcohol dependence and treatment.   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 Patient presented to hospital voluntarily earlier this AM. At this time patient presents irritable, angry, fair historian.  States she came at the encouragement of family, friends who " told me they thought I was drinking more than normal, being more erratic, concerned that I had cut myself recently". She states she cut self superficially last week " as a stress relief", denies suicidal intent. She also reports she had played "Guernsey Roulette"  last week by overdosing on prescribed Buspar, Klonopin.. States she took" whatever was left in the bottle" and explains " it was like Guernsey Roulette because if I died I did not care and if they helped me feel better ,  well then I would feel  better". States " nothing happened, I just fell asleep". Does report she had some brief auditory hallucinations following overdose but not since then.  Describes neuro-vegetative symptoms - increased appetite, poor sleep ( states " I have not slept at all over the last three days") , anhedonia, " boredom".  Admission UDS positive for cannabis, negative for BZDs, admission BAL negative. No prior psychiatric admissions. Reports she has been diagnosed with Bipolar Disorder and  " Anxiety Disorder", and describes excessive worrying , some agoraphobia, although currently patient reports motivation to avoid public places is " because people make me irritated and angry".  History of self cutting , most recently last week. Denies history of psychosis other than brief hallucinations following recent overdose . She sees Dr. Evelene Croon for psychiatric medication management.  Reports history of asthma. Reports she has been diagnosed with pre-diabetes . NKDA. Smokes cigarettes .  Reports she drinks several times a week , up to 5-6 shots of liquor ( vodka) per episode. Uses cannabis regularly. Denies history of withdrawal seizures or DTs. States she last drank 4-5 days ago. Currently does not endorse symptoms of alcohol WDL and does not appear tremulous or restless.  Home medications - Klonopin 1 mgr TID, Buspar 30 mgrs BID, last took last week, Albuterol inhaler. Was also prescribed Xolair but has not been taking recently.  Reports she has been on these psychiatric medications for more than a year .  Parents alive, separated, has a full brother and two half siblings . Reports she has an uncle who has Schizophrenia. A cousin committed suicide.  Dx- Bipolar Disorder, Mixed versus Substance Induced Mood Disorder . Cannabis Use Disorder, Alcohol  Use Disorder .   Plan- inpatient treatment. We reviewed medication options- Patient reports that Arman Filter was well tolerated and helpful during a past trial and   is expressing interest in restarting this medication, which she states she last took 1-2 months ago.  Start Vraylar 1.5 mgrs QDAY for mood disorder  Trazodone 50 mgrs QHS PRN for insomnia as needed  Ativan PRN for alcohol WDL per CIWA protocol  as needed for WDL .  Agitation protocol as needed for acute agitation      Musculoskeletal: Strength & Muscle Tone: within normal limits- no tremors , no diaphoresis, no restlessness or agitated Gait & Station: normal Patient leans: N/A  Psychiatric Specialty Exam: Physical Exam  ROS no headache, no chest pain, no shortness of breath, no vomiting   Blood pressure (!) 130/98, pulse 86, temperature 98.4 F (36.9 C), temperature source Oral, resp. rate 20, height 5\' 5"  (1.651 m), weight 88 kg, SpO2 100 %.Body mass index is 32.28 kg/m.  General Appearance: Fairly Groomed  Eye Contact:  Fair  Speech:  Normal Rate  Volume:  Normal  Mood:  irritable, angry  Affect:  Congruent  Thought Process:  Linear and Descriptions of Associations: Intact  Orientation:  Other:  fully alert and attentive  Thought Content:  no hallucinations, no delusions, not internally preoccupied   Suicidal Thoughts:  No denies suicidal or self injurious ideations, denies homicidal or violent ideations, currently contracts for safety on unit   Homicidal Thoughts:  No  Memory:  recent and remote grossly intact   Judgement:  Fair  Insight:  Fair  Psychomotor Activity:  Normal  Concentration:  Concentration: Fair and Attention Span: Fair  Recall:  [employer] of Knowledge:  Fair  Language:  Good  Akathisia:  No  Handed:  Right  AIMS (if indicated):     Assets:  Communication Skills Desire for Improvement Resilience  ADL's:  Intact  Cognition:  WNL  Sleep:  Number of Hours: 5.75      COGNITIVE FEATURES THAT CONTRIBUTE TO RISK:  Closed-mindedness and Loss of executive function    SUICIDE RISK:   Moderate:  Frequent suicidal ideation with limited intensity, and  duration, some specificity in terms of plans, no associated intent, good self-control, limited dysphoria/symptomatology, some risk factors present, and identifiable protective factors, including available and accessible social support.  PLAN OF CARE: Patient will be admitted to inpatient psychiatric unit for stabilization and safety. Will provide and encourage milieu participation. Provide medication management and maked adjustments as needed.  Will follow daily.    I certify that inpatient services furnished can reasonably be expected to improve the patient's condition.   Jenne Campus, MD 03/23/2019, 9:46 AM

## 2019-03-23 NOTE — Tx Team (Signed)
Interdisciplinary Treatment and Diagnostic Plan Update  03/23/2019 Time of Session: 9:25am Claudia Winters MRN: [id number]  Principal Diagnosis: Bipolar I disorder with depression, severe ([place])  Secondary Diagnoses: Principal Problem:   Bipolar I disorder with depression, severe ([place])   Current Medications:  Current Facility-Administered Medications  Medication Dose Route Frequency Provider Last Rate Last Dose  . acetaminophen (TYLENOL) tablet 650 mg  650 mg Oral Q6H PRN Suella Broad, FNP      . albuterol (VENTOLIN HFA) 108 (90 Base) MCG/ACT inhaler 2 puff  2 puff Inhalation Q6H PRN Merlyn Lot E, NP   2 puff at 03/23/19 [phone]  . alum & mag hydroxide-simeth (MAALOX/MYLANTA) 200-200-20 MG/5ML suspension 30 mL  30 mL Oral Q4H PRN Starkes-Perry, Gayland Curry, FNP      . diphenhydrAMINE (BENADRYL) capsule 25 mg  25 mg Oral Q6H PRN Suella Broad, FNP      . hydrOXYzine (ATARAX/VISTARIL) tablet 25 mg  25 mg Oral TID PRN Suella Broad, FNP      . magnesium hydroxide (MILK OF MAGNESIA) suspension 30 mL  30 mL Oral Daily PRN Starkes-Perry, Gayland Curry, FNP      . mometasone-formoterol (DULERA) 100-5 MCG/ACT inhaler 2 puff  2 puff Inhalation BID Derrill Center, NP   2 puff at 03/23/19 0900   PTA Medications: Facility-Administered Medications Prior to Admission  Medication Dose Route Frequency Provider Last Rate Last Dose  . omalizumab Arvid Right) injection 300 mg  300 mg Subcutaneous Q28 days Kennith Gain, MD   300 mg at 10/16/18 1152   Medications Prior to Admission  Medication Sig Dispense Refill Last Dose  . albuterol (PROAIR HFA) 108 (90 Base) MCG/ACT inhaler Inhale into the lungs every 6 (six) hours as needed for wheezing or shortness of breath.   Not Taking at Unknown time  . budesonide-formoterol (SYMBICORT) 160-4.5 MCG/ACT inhaler Inhale 2 puffs into the lungs 2 (two) times daily. 1 Inhaler 5   . Levonorgestrel (KYLEENA) 19.5 MG IUD Kyleena 17.5 mcg/24 hour (5  years) intrauterine device  Take 1 device by intrauterine route.       Patient Stressors:    Patient Strengths:    Treatment Modalities: Medication Management, Group therapy, Case management,  1 to 1 session with clinician, Psychoeducation, Recreational therapy.   Physician Treatment Plan for Primary Diagnosis: Bipolar I disorder with depression, severe ([place]) Long Term Goal(s):     Short Term Goals:    Medication Management: Evaluate patient's response, side effects, and tolerance of medication regimen.  Therapeutic Interventions: 1 to 1 sessions, Unit Group sessions and Medication administration.  Evaluation of Outcomes: Not Met  Physician Treatment Plan for Secondary Diagnosis: Principal Problem:   Bipolar I disorder with depression, severe ([place])  Long Term Goal(s):     Short Term Goals:       Medication Management: Evaluate patient's response, side effects, and tolerance of medication regimen.  Therapeutic Interventions: 1 to 1 sessions, Unit Group sessions and Medication administration.  Evaluation of Outcomes: Not Met   RN Treatment Plan for Primary Diagnosis: Bipolar I disorder with depression, severe ([place]) Long Term Goal(s): Knowledge of disease and therapeutic regimen to maintain health will improve  Short Term Goals: Ability to demonstrate self-control, Ability to participate in decision making will improve, Ability to verbalize feelings will improve and Ability to identify and develop effective coping behaviors will improve  Medication Management: RN will administer medications as ordered by provider, will assess and evaluate patient's response and provide education to  patient for prescribed medication. RN will report any adverse and/or side effects to prescribing provider.  Therapeutic Interventions: 1 on 1 counseling sessions, Psychoeducation, Medication administration, Evaluate responses to treatment, Monitor vital signs and CBGs as ordered, Perform/monitor  CIWA, COWS, AIMS and Fall Risk screenings as ordered, Perform wound care treatments as ordered.  Evaluation of Outcomes: Not Met   LCSW Treatment Plan for Primary Diagnosis: Bipolar I disorder with depression, severe ([place]) Long Term Goal(s): Safe transition to appropriate next level of care at discharge, Engage patient in therapeutic group addressing interpersonal concerns.  Short Term Goals: Engage patient in aftercare planning with referrals and resources  Therapeutic Interventions: Assess for all discharge needs, 1 to 1 time with Social worker, Explore available resources and support systems, Assess for adequacy in community support network, Educate family and significant other(s) on suicide prevention, Complete Psychosocial Assessment, Interpersonal group therapy.  Evaluation of Outcomes: Not Met   Progress in Treatment: Attending groups: No. New to unit  Participating in groups: No. Taking medication as prescribed: Yes. Toleration medication: Yes. Family/Significant other contact made: No, will contact:  if patient consents to collateral contacts Patient understands diagnosis: Yes. Discussing patient identified problems/goals with staff: Yes. Medical problems stabilized or resolved: Yes. Denies suicidal/homicidal ideation: Yes. Issues/concerns per patient self-inventory: No. Other:   New problem(s) identified: None   New Short Term/Long Term Goal(s): medication stabilization, elimination of SI thoughts, development of comprehensive mental wellness plan.    Patient Goals:  "I cannot be here, it is not conducive to my well-being. I am becoming more agitated by being here. I want to discharge as soon as possible"  Discharge Plan or Barriers: Patient recently admitted. CSW will continue to follow and assess for appropriate referrals and possible discharge planning.    Reason for Continuation of Hospitalization: Depression Medication stabilization  Estimated Length of Stay:  3-5 days   Attendees: Patient: Claudia Winters  03/23/2019 9:53 AM  Physician: Dr. Neita Garnet, MD 03/23/2019 9:53 AM  Nursing: Grayland Ormond, RN 03/23/2019 9:53 AM  RN Care Manager: 03/23/2019 9:53 AM  Social Worker: Radonna Ricker, LCSW 03/23/2019 9:53 AM  Recreational Therapist:  03/23/2019 9:53 AM  Other: Harriett Sine, NP  03/23/2019 9:53 AM  Other:  03/23/2019 9:53 AM  Other: 03/23/2019 9:53 AM    Scribe for Treatment Team: Marylee Floras, [place] 03/23/2019 9:53 AM

## 2019-03-23 NOTE — Progress Notes (Addendum)
D:  Patient's self inventory sheet, patient has poor sleep, no sleep medication.  Poor appetite, normal energy level, good concentration.  Denied depression, hopeless and anxiety.  Denied withdrawals, then checked agitation, irritability.  Denied SI.  Denied physical problems, checked headaches.   Goal is to leave.  No discharge plans. A:  Patient refused many medications today.  Did not want to be at [facility].  Emotional support and encouragement given patient. R:  Patient denied SI and HI.  Denied A/V hallucination at this time.  Safety maintained with 15 minute checks.

## 2019-03-24 MED ORDER — CARIPRAZINE HCL 1.5 MG PO CAPS: 1.5000 mg | ORAL_CAPSULE | Freq: Every day | ORAL | 0 refills | Status: DC

## 2019-03-24 NOTE — BHH Suicide Risk Assessment (Addendum)
[place] INPATIENT:  Family/Significant Other Suicide Prevention Education  Suicide Prevention Education:  Patient Refusal for Family/Significant Other Suicide Prevention Education: The patient Claudia Winters has refused to provide written consent for family/significant other to be provided Family/Significant Other Suicide Prevention Education during admission and/or prior to discharge.  Physician notified.  SPE reviewed with patient.   Joellen Jersey 03/24/2019, 10:27 AM

## 2019-03-24 NOTE — BHH Suicide Risk Assessment (Signed)
[facility] Discharge Suicide Risk Assessment   Principal Problem: Bipolar I disorder with depression, severe ([place]) Discharge Diagnoses: Principal Problem:   Bipolar I disorder with depression, severe ([place])   Total Time spent with patient: 15 minutes  Musculoskeletal: Strength & Muscle Tone: within normal limits Gait & Station: normal Patient leans: N/A  Psychiatric Specialty Exam: Review of Systems  All other systems reviewed and are negative.   Blood pressure (!) 130/98, pulse 86, temperature 98.4 F (36.9 C), temperature source Oral, resp. rate 20, height 5\' 5"  (1.651 m), weight 88 kg, SpO2 100 %.Body mass index is 32.28 kg/m.  General Appearance: Casual  Eye Contact::  Fair  Speech:  Normal Rate409  Volume:  Normal  Mood:  Irritable  Affect:  Congruent  Thought Process:  Coherent and Descriptions of Associations: Circumstantial  Orientation:  Full (Time, Place, and Person)  Thought Content:  Logical  Suicidal Thoughts:  No  Homicidal Thoughts:  No  Memory:  Immediate;   Fair Recent;   Fair Remote;   Fair  Judgement:  Impaired  Insight:  Lacking  Psychomotor Activity:  Normal  Concentration:  Fair  Recall:  [employer] of Knowledge:Fair  Language: Fair  Akathisia:  Negative  Handed:  Right  AIMS (if indicated):     Assets:  Desire for Improvement Resilience  Sleep:  Number of Hours: 4.25  Cognition: WNL  ADL's:  Intact   Mental Status Per Nursing Assessment::   On Admission:  Suicidal ideation indicated by patient, Suicide plan, Self-harm thoughts, Self-harm behaviors  Demographic Factors:  NA  Loss Factors: NA  Historical Factors: Impulsivity  Risk Reduction Factors:   Positive therapeutic relationship  Continued Clinical Symptoms:  Bipolar Disorder:   Depressive phase Alcohol/Substance Abuse/Dependencies  Cognitive Features That Contribute To Risk:  Thought constriction (tunnel vision)    Suicide Risk:  Minimal: No identifiable suicidal ideation.   Patients presenting with no risk factors but with morbid ruminations; may be classified as minimal risk based on the severity of the depressive symptoms    Plan Of Care/Follow-up recommendations:  Activity:  ad lib  Sharma Covert, MD 03/24/2019, 9:19 AM

## 2019-03-24 NOTE — Progress Notes (Signed)
  [facility] Adult Case Management Discharge Plan :  Will you be returning to the same living situation after discharge:  Yes,  home At discharge, do you have transportation home?: Yes,  taking a Lyft at 11:15am Do you have the ability to pay for your medications: No. Provided samples.   Release of information consent forms completed and in the chart. Work Quarry manager on chart. Patient to Follow up at: Follow-up Information    Chucky May, MD Follow up on 03/26/2019.   Specialty: Psychiatry Why: Medication management appointment is Thursday, 9/24 at 4:15p.  Appt will be virtual please call the office within 24 hours of discharge to provide info for appt.  Contact information: [place]  [id number] [phone]           Next level of care provider has access to [place] and Suicide Prevention discussed: Yes,  with patient. Patient declines consents.    Has patient been referred to the Quitline?: Patient refused referral  Patient has been referred for addiction treatment: Yes  Joellen Jersey, [place] 03/24/2019, 10:41 AM

## 2019-03-24 NOTE — Discharge Summary (Signed)
Physician Discharge Summary Note  Patient:  Claudia Winters is an 28 y.o., female MRN:  [id number] DOB:  May 30, 1991 Patient phone:  [phone] (home)  Patient address:   [address] [place] [address],  Total Time spent with patient: 15 minutes  Date of Admission:  03/22/2019 Date of Discharge: 03/24/19  Reason for Admission:  Klonopin and alcohol abuse  Principal Problem: Bipolar I disorder with depression, severe (HCC) Discharge Diagnoses: Principal Problem:   Bipolar I disorder with depression, severe (HCC)   Past Psychiatric History: History of anxiety, bipolar disorder. Current patient of Dr. Evelene Croon. Denies history of hospitalizations or suicide attempts.  Past Medical History:  Past Medical History:  Diagnosis Date  . Asthma     Past Surgical History:  Procedure Laterality Date  . NO PAST SURGERIES     Family History:  Family History  Problem Relation Age of Onset  . Allergic rhinitis Neg Hx   . Angioedema Neg Hx   . Asthma Neg Hx   . Atopy Neg Hx   . Eczema Neg Hx   . Immunodeficiency Neg Hx   . Urticaria Neg Hx    Family Psychiatric  History: Uncle with schizophrenia. Cousin died by suicide. Social History:  Social History   Substance and Sexual Activity  Alcohol Use Yes  . Alcohol/week: 5.0 standard drinks  . Types: 5 Shots of liquor per week   Comment: social drinker     Social History   Substance and Sexual Activity  Drug Use No    Social History   Socioeconomic History  . Marital status: Single    Spouse name: Not on file  . Number of children: Not on file  . Years of education: Not on file  . Highest education level: Not on file  Occupational History  . Not on file  Social Needs  . Financial resource strain: Not on file  . Food insecurity    Worry: Not on file    Inability: Not on file  . Transportation needs    Medical: Not on file    Non-medical: Not on file  Tobacco Use  . Smoking status: Current Every Day Smoker     Packs/day: 2.00    Types: Cigarettes    Start date: 05/01/2018  . Smokeless tobacco: Never Used  Substance and Sexual Activity  . Alcohol use: Yes    Alcohol/week: 5.0 standard drinks    Types: 5 Shots of liquor per week    Comment: social drinker  . Drug use: No  . Sexual activity: Not on file  Lifestyle  . Physical activity    Days per week: Not on file    Minutes per session: Not on file  . Stress: Not on file  Relationships  . Social Musician on phone: Not on file    Gets together: Not on file    Attends religious service: Not on file    Active member of club or organization: Not on file    Attends meetings of clubs or organizations: Not on file    Relationship status: Not on file  Other Topics Concern  . Not on file  Social History Narrative  . Not on file    Hospital Course:  From admission H&P: Claudia Winters is a 28 year old female with history of anxiety, bipolar disorder, asthma, and prediabetes, presenting vountarily for treatment of suicidal ideation. She is significantly irritable on assessment and requesting discharge. She reports family members urged her to  come to the hospital due to her increased alcohol consumption and erratic behaviors, and she states "I will never speak to them again now." She reports drinking 6 shots of vodka 3-4 times per week, with last drink on Thursday. She is prescribed Klonopin 1 mg TID and Buspar 30 mg BID by Dr. Toy Care and states that she "played [place] roulette with my medications" by overtaking them several times last week. She is unable to state how many she took at once but states that she "went through two bottles in a week" and did not care if she died. TTS notes indicate she reported misusing her medications for several months now and typically runs out of medication halfway through the month. She reports last use of Klonopin was 7 days ago. Her family insisted that she come to the hospital after she told them about overtaking  her medicine. She states her family was concerned about her increased sexual activity as well as drinking. She reports being around other people is her main trigger. She states she has always felt agitated around other people, but feels fine when alone. She lost her job on 03/05/19 due to a company merger and because "they could tell I really didn't want to be there." She reports having AH of voices and noises when overtaking her Klonopin but otherwise denies current or history of AVH. She has history of self-injurious behaviors and made superficial cuts to herself last week to relieve stress. She denies withdrawal symptoms. She admits to intermittent marijuana use "when agitated." Denies other drug use. Admission BAL negative. UDS positive for THC only. She had been taking Vraylar previously but stopped when she stopped going to the doctor during the pandemic due to fears of contracting COVID-19. AST 84. ALT 108.  Claudia Winters was admitted for suicidal ideation with alcohol and Klonopin abuse. She remained on the [facility] unit for two days. She was started on Vraylar. She refused to participate in therapy. She was irritable and demanding discharge throughout hospitalization. She has shown stable mood, affect, sleep, and interaction. She has shown no signs of withdrawal. She denies any SI/HI/AVH and contracts for safety. She is discharging on the medications listed below. She agrees to follow up with Dr. Toy Care (see below). She is provided with prescription for medication upon discharge. She is discharging home via [place].   Physical Findings: AIMS: Facial and Oral Movements Muscles of Facial Expression: None, normal Lips and Perioral Area: None, normal Jaw: None, normal Tongue: None, normal,Extremity Movements Upper (arms, wrists, hands, fingers): None, normal Lower (legs, knees, ankles, toes): None, normal, Trunk Movements Neck, shoulders, hips: None, normal, Overall Severity Severity of abnormal movements (highest  score from questions above): None, normal Incapacitation due to abnormal movements: None, normal Patient's awareness of abnormal movements (rate only patient's report): No Awareness, Dental Status Current problems with teeth and/or dentures?: No Does patient usually wear dentures?: No  CIWA:  CIWA-Ar Total: 2 COWS:  COWS Total Score: 2  Musculoskeletal: Strength & Muscle Tone: within normal limits Gait & Station: normal Patient leans: N/A  Psychiatric Specialty Exam: Physical Exam  Nursing note and vitals reviewed. Constitutional: She is oriented to person, place, and time. She appears well-developed and well-nourished.  Cardiovascular: Normal rate.  Respiratory: Effort normal.  Neurological: She is alert and oriented to person, place, and time.    Review of Systems  Constitutional: Negative.   Respiratory: Negative for cough and shortness of breath.   Cardiovascular: Negative for chest pain.  Gastrointestinal: Negative  for abdominal pain, diarrhea, nausea and vomiting.  Neurological: Negative for tremors, sensory change and headaches.  Psychiatric/Behavioral: Positive for depression (stable on medication) and substance abuse. Negative for hallucinations and suicidal ideas. The patient is not nervous/anxious and does not have insomnia.     Blood pressure (!) 130/98, pulse 86, temperature 98.4 F (36.9 C), temperature source Oral, resp. rate 20, height 5\' 5"  (1.651 m), weight 88 kg, SpO2 100 %.Body mass index is 32.28 kg/m.  See MD's discharge SRA      Has this patient used any form of tobacco in the last 30 days? (Cigarettes, Smokeless Tobacco, Cigars, and/or Pipes)  No  Blood Alcohol level:  Lab Results  Component Value Date   ETH <10 03/22/2019    Metabolic Disorder Labs:  Lab Results  Component Value Date   HGBA1C 5.6 03/23/2019   MPG 114.02 03/23/2019   No results found for: PROLACTIN Lab Results  Component Value Date   CHOL 172 03/22/2019   TRIG 123  03/22/2019   HDL 65 03/22/2019   CHOLHDL 2.6 03/22/2019   VLDL 25 03/22/2019   LDLCALC 82 03/22/2019    See Psychiatric Specialty Exam and Suicide Risk Assessment completed by Attending Physician prior to discharge.  Discharge destination:  Home  Is patient on multiple antipsychotic therapies at discharge:  No   Has Patient had three or more failed trials of antipsychotic monotherapy by history:  No  Recommended Plan for Multiple Antipsychotic Therapies: NA  Discharge Instructions    Discharge instructions   Complete by: As directed    Patient is instructed to take all prescribed medications as recommended. Report any side effects or adverse reactions to your outpatient psychiatrist. Patient is instructed to abstain from alcohol and illegal drugs while on prescription medications. In the event of worsening symptoms, patient is instructed to call the crisis hotline, 911, or go to the nearest emergency department for evaluation and treatment.     Allergies as of 03/24/2019   No Known Allergies     Medication List    TAKE these medications     Indication  budesonide-formoterol 160-4.5 MCG/ACT inhaler Commonly known as: Symbicort Inhale 2 puffs into the lungs 2 (two) times daily.  Indication: Asthma   cariprazine capsule Commonly known as: VRAYLAR Take 1 capsule (1.5 mg total) by mouth daily.  Indication: Depressive Phase of Manic-Depression   Kyleena 19.5 MG Iud Generic drug: Levonorgestrel Kyleena 17.5 mcg/24 hour (5 years) intrauterine device  Take 1 device by intrauterine route.  Indication: Birth Control Treatment   ProAir HFA 108 [phone] Base) MCG/ACT inhaler Generic drug: albuterol Inhale into the lungs every 6 (six) hours as needed for wheezing or shortness of breath.  Indication: Asthma      Follow-up Information    Milagros Evener, MD Follow up on 03/26/2019.   Specialty: Psychiatry Why: Medication management appointment is Thursday, 9/24 at 4:15p.  Appt  will be virtual please call the office within 24 hours of discharge to provide info for appt.  Contact information: [address] [place] [address] [phone]           Follow-up recommendations: Activity as tolerated. Diet as recommended by primary care physician. Keep all scheduled follow-up appointments as recommended.   Comments:   Patient is instructed to take all prescribed medications as recommended. Report any side effects or adverse reactions to your outpatient psychiatrist. Patient is instructed to abstain from alcohol and illegal drugs while on prescription  medications. In the event of worsening symptoms, patient is instructed to call the crisis hotline, 911, or go to the nearest emergency department for evaluation and treatment.  Signed: Aldean BakerJanet E Annayah Worthley, NP 03/24/2019, 9:37 AM

## 2019-05-05 IMAGING — US US THYROID
1 series · 14 of 25 positions shown · non-contrast
Comparison: None.

CLINICAL DATA: Thyromegaly on exam

EXAM:
THYROID ULTRASOUND
TECHNIQUE: Ultrasound examination of the thyroid gland and adjacent soft
tissues was performed.

[Series 1: us thyroid · 0.06mm/px · 14 of 37 slices shown]
[im 1/37]
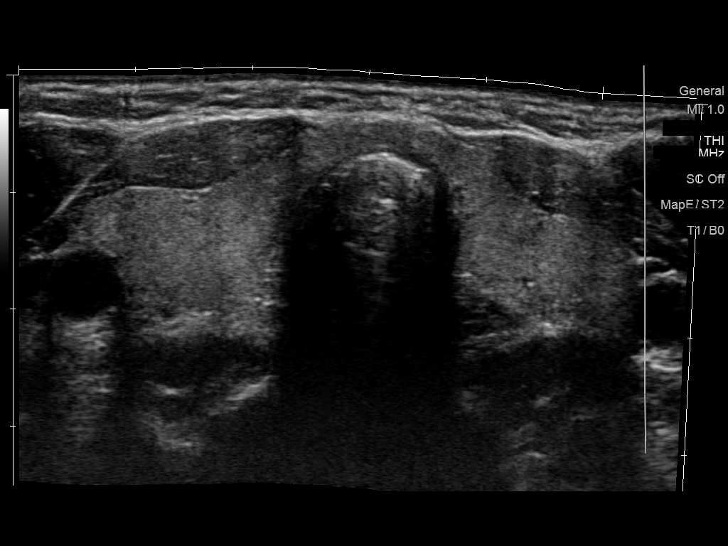
[im 4/37]
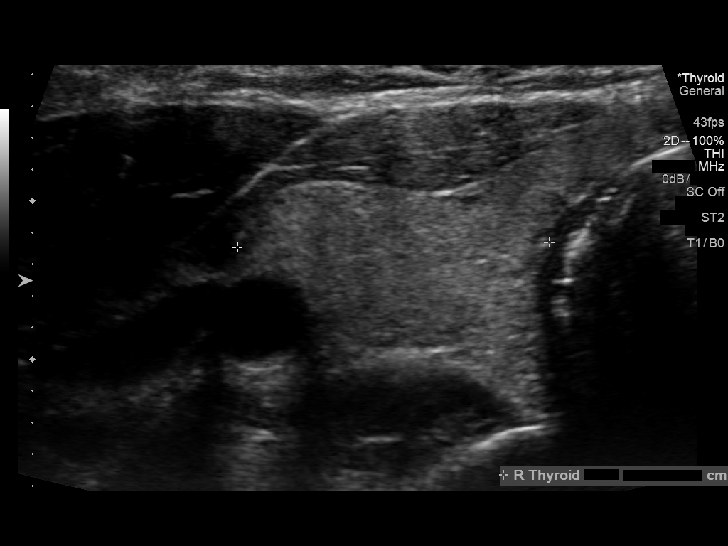
[im 7/37]
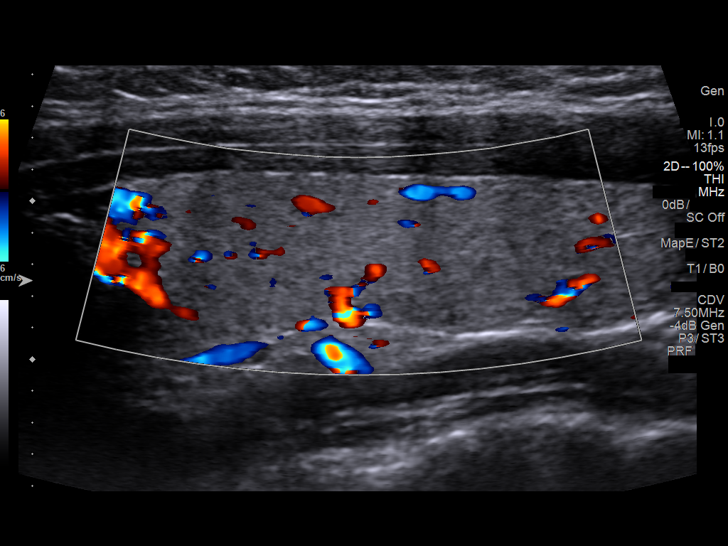
[im 10/37]
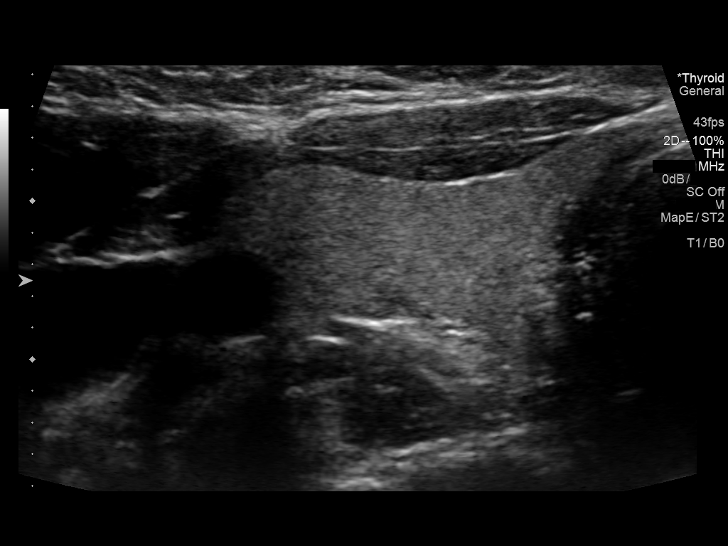
[im 13/37]
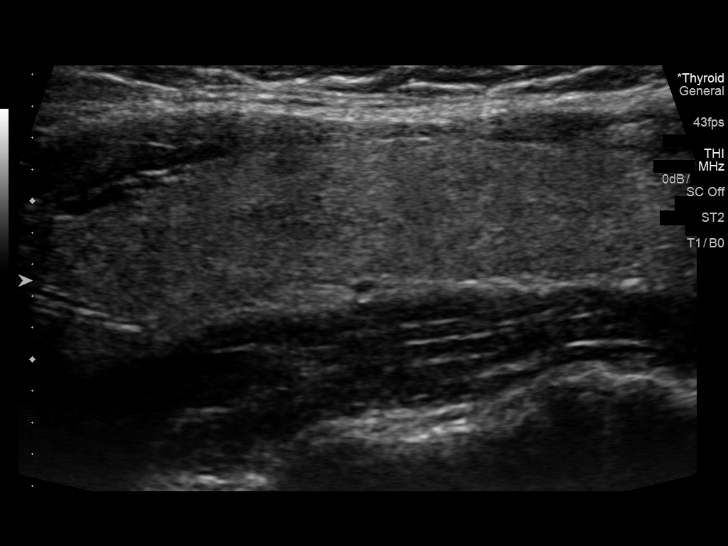
[im 14/37]
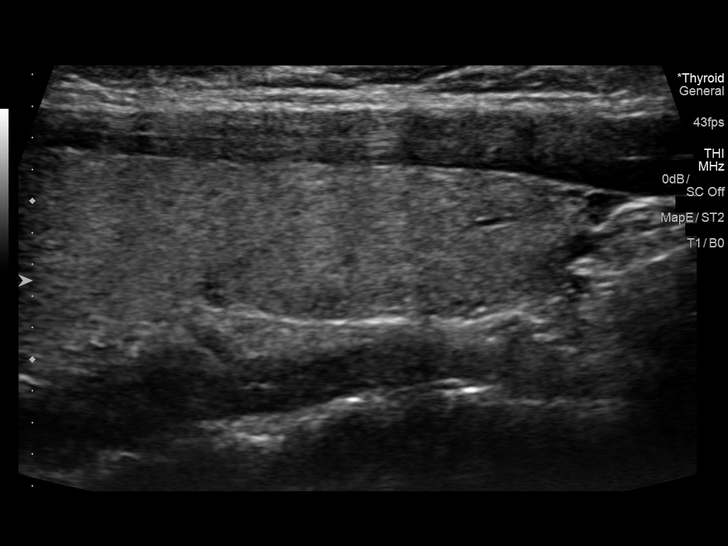
[im 17/37]
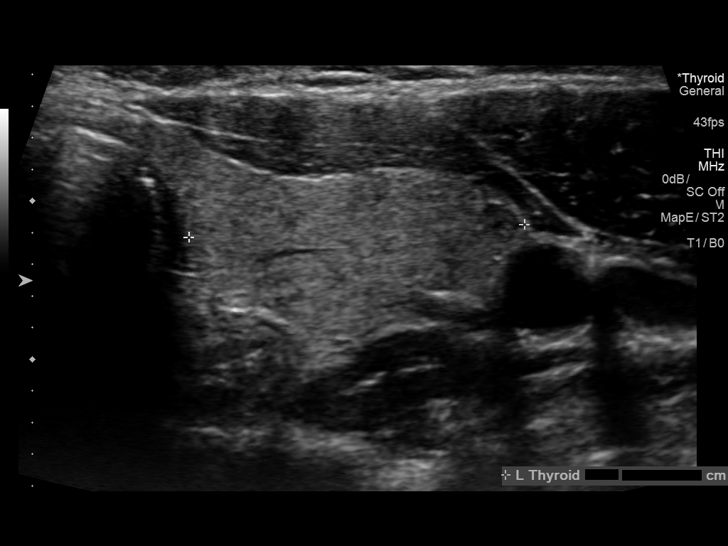
[im 20/37]
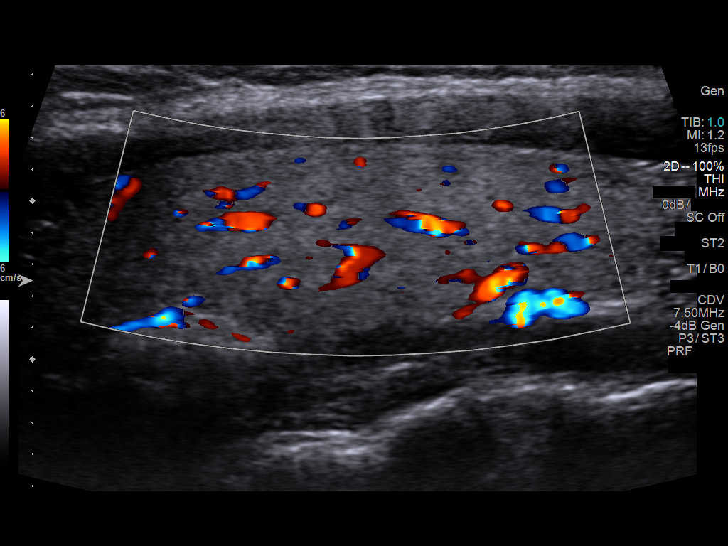
[im 23/37]
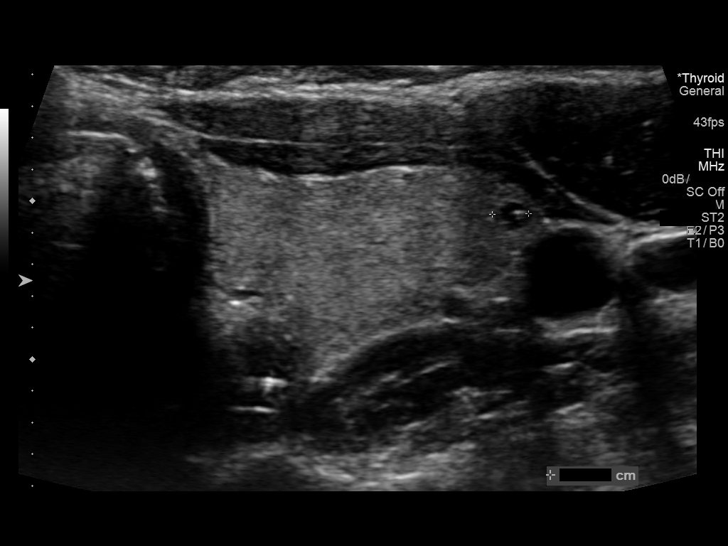
[im 25/37]
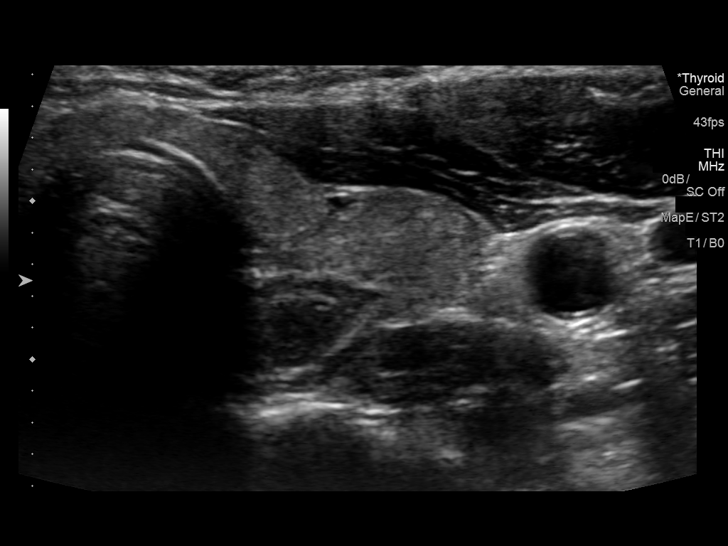
[im 28/37]
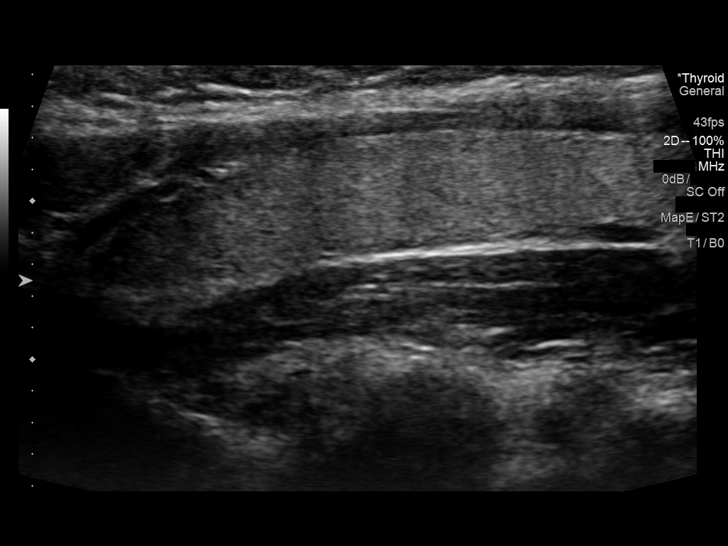
[im 31/37]
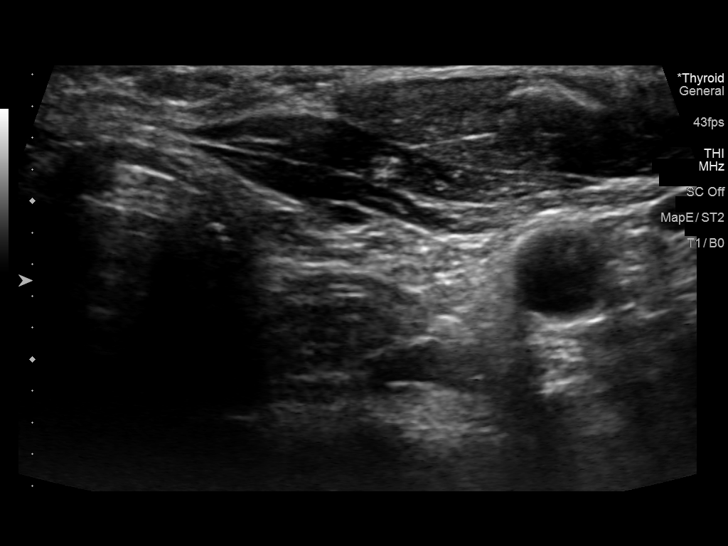
[im 34/37]
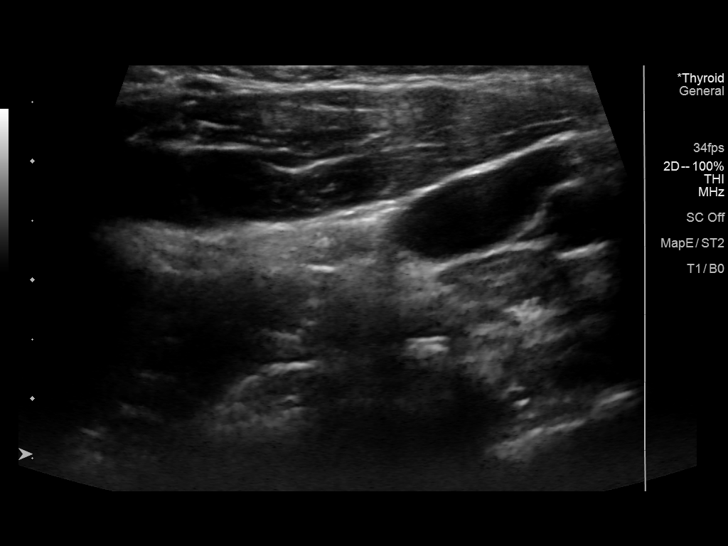
[im 37/37]
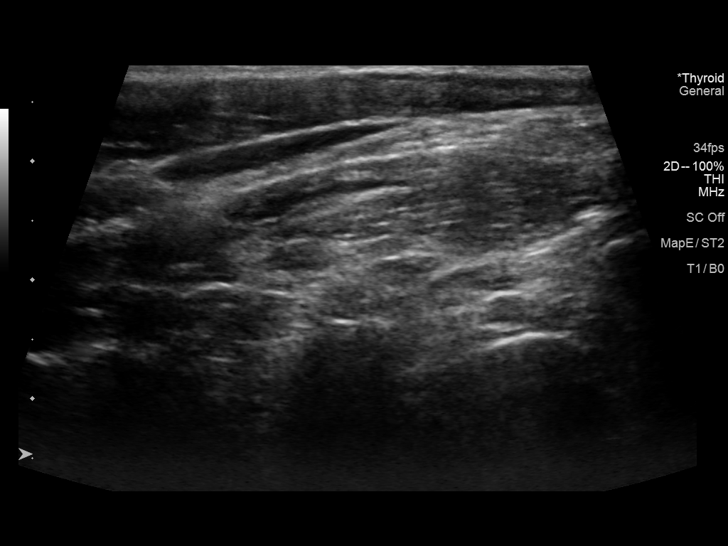

[14 of 25 positions shown; findings below may reference images not displayed]

FINDINGS: Parenchymal Echotexture: Normal

Isthmus: 3 mm

Right lobe: 4.9 x 1.4 x 2.0 cm

Left lobe: 4.6 x 1.2 x 2.1 cm

_________________________________________________________

Estimated total number of nodules >/= 1 cm: 0

Number of spongiform nodules >/=  2 cm not described below (TR1): 0

Number of mixed cystic and solid nodules >/= 1.5 cm not described
below (TR2): 0

_________________________________________________________

No discrete nodules are seen within the thyroid gland.
IMPRESSION: Normal thyroid ultrasound

The above is in keeping with the ACR TI-RADS recommendations - [HOSPITAL] 6424;[DATE].

## 2019-07-06 ENCOUNTER — Telehealth: Payer: Self-pay | Admitting: *Deleted

## 2019-07-06 NOTE — Telephone Encounter (Signed)
Patient would like to re-start her Xolair injections for her asthma. Please advise. Her last injection was April of 2020.

## 2019-07-07 DIAGNOSIS — F900 Attention-deficit hyperactivity disorder, predominantly inattentive type: Secondary | ICD-10-CM | POA: Diagnosis not present

## 2019-07-07 DIAGNOSIS — J4540 Moderate persistent asthma, uncomplicated: Secondary | ICD-10-CM | POA: Diagnosis not present

## 2019-07-07 DIAGNOSIS — F3131 Bipolar disorder, current episode depressed, mild: Secondary | ICD-10-CM | POA: Diagnosis not present

## 2019-07-07 DIAGNOSIS — Z23 Encounter for immunization: Secondary | ICD-10-CM | POA: Diagnosis not present

## 2019-07-07 DIAGNOSIS — J4541 Moderate persistent asthma with (acute) exacerbation: Secondary | ICD-10-CM | POA: Diagnosis not present

## 2019-07-07 DIAGNOSIS — F410 Panic disorder [episodic paroxysmal anxiety] without agoraphobia: Secondary | ICD-10-CM | POA: Diagnosis not present

## 2019-07-07 NOTE — Telephone Encounter (Signed)
You can check to see if she has dose and let her know she can restart but she will need to come in for appt and provide insurance card to restart

## 2019-07-07 NOTE — Telephone Encounter (Signed)
She would resume at the previous dose she received Xolair 150mg  q4 wks.   Do we still have her Xolair?   Tammy do we need to resubmit or can we go ahead and restart?

## 2019-07-08 NOTE — Telephone Encounter (Signed)
Did not have any of the patient's Xolair here. Called and spoke with the patient and she has been placed on the schedule to come see you tomorrow for a follow up visit and to discuss re-starting Xolair for insurances purposes and will be bringing and updated insurance card for re-approval.

## 2019-07-09 ENCOUNTER — Other Ambulatory Visit: Payer: Self-pay

## 2019-07-09 ENCOUNTER — Encounter: Payer: Self-pay | Admitting: Allergy

## 2019-07-09 ENCOUNTER — Ambulatory Visit (INDEPENDENT_AMBULATORY_CARE_PROVIDER_SITE_OTHER): Payer: BC Managed Care – PPO | Admitting: Allergy

## 2019-07-09 VITALS — BP 102/70 | HR 90 | Temp 98.4°F | Resp 18 | Ht 64.5 in | Wt 195.4 lb

## 2019-07-09 DIAGNOSIS — L508 Other urticaria: Secondary | ICD-10-CM | POA: Diagnosis not present

## 2019-07-09 DIAGNOSIS — J3089 Other allergic rhinitis: Secondary | ICD-10-CM | POA: Diagnosis not present

## 2019-07-09 DIAGNOSIS — J4541 Moderate persistent asthma with (acute) exacerbation: Secondary | ICD-10-CM | POA: Diagnosis not present

## 2019-07-09 DIAGNOSIS — J988 Other specified respiratory disorders: Secondary | ICD-10-CM | POA: Diagnosis not present

## 2019-07-09 DIAGNOSIS — J302 Other seasonal allergic rhinitis: Secondary | ICD-10-CM

## 2019-07-09 MED ORDER — FEXOFENADINE HCL 180 MG PO TABS: 180.0000 mg | ORAL_TABLET | Freq: Every day | ORAL | 2 refills | Status: DC

## 2019-07-09 MED ORDER — BUDESONIDE-FORMOTEROL FUMARATE 160-4.5 MCG/ACT IN AERO: 2.0000 | INHALATION_SPRAY | Freq: Two times a day (BID) | RESPIRATORY_TRACT | 5 refills | Status: DC

## 2019-07-09 MED ORDER — FAMOTIDINE 20 MG PO TABS: 20.0000 mg | ORAL_TABLET | Freq: Two times a day (BID) | ORAL | 5 refills | Status: DC

## 2019-07-09 MED ORDER — ALBUTEROL SULFATE HFA 108 (90 BASE) MCG/ACT IN AERS: 1.0000 | INHALATION_SPRAY | Freq: Four times a day (QID) | RESPIRATORY_TRACT | 1 refills | Status: DC | PRN

## 2019-07-09 NOTE — Progress Notes (Signed)
Follow-up Note  RE: Claudia Winters MRN: [id number] DOB: March 15, 1991 Date of Office Visit: 07/09/2019   History of present illness: Claudia Winters is a 29 y.o. female presenting today for follow-up of asthma, allergic rhinitis and urticaria.  She was last seen in the office on 08/27/18 by myself.  At that time she was treated for an asthma exacerbation.   She currently states she is having an asthma flare with wheezing, shortness of breath, and a cold sensation in her chest and also states feels like it is hard to swallow at time.  She is using albuterol twice a day at this time.   She did see her PCP for these symptoms and she did get prednisone course to help with this flare and just picked up the course this morning.  She is using Symbicort 160 mcg 2 puffs twice a day.  She states she has been struggling with her asthma control since she stopped going for Xolair in April due to Covid and the concern for needing to come to the office and risk possible exposure.  She states she definitely can tell a difference on Xolair versus being off and would like to restart at this time. symbicort 2 puffs twice a day  She is also having hives that wake her up from sleep with itching for the last 3-4 months.  She is on Allegra once a day and singulair daily. She also reports having watery eyes, sinus HA and she can feel the nerves in her mouth.    She reports having recurrent sinus infections about 3-4 times a year.  She state she usually will also have a respiratory infection at the same time.  She states normally will have symptoms for at least 1 to 2 weeks before receiving antibiotic therapy.  Had flu vaccine on Tuesday this week.  Had a pneumonia shot last year.    Review of systems: Review of Systems  Constitutional: Negative for chills and fever.  HENT: Positive for congestion and sinus pain. Negative for ear discharge, ear pain and nosebleeds.   Eyes: Negative.   Respiratory: Positive for cough,  shortness of breath and wheezing.   Cardiovascular: Negative.   Gastrointestinal: Negative.   Musculoskeletal: Negative.   Skin: Positive for itching and rash.  Neurological: Positive for headaches.    All other systems negative unless noted above in HPI  Past medical/social/surgical/family history have been reviewed and are unchanged unless specifically indicated below.  No changes  Medication List: Current Outpatient Medications  Medication Sig Dispense Refill  . albuterol (PROAIR HFA) 108 (90 Base) MCG/ACT inhaler Inhale 1-2 puffs into the lungs every 6 (six) hours as needed for wheezing or shortness of breath. 18 g 1  . budesonide-formoterol (SYMBICORT) 160-4.5 MCG/ACT inhaler Inhale 2 puffs into the lungs 2 (two) times daily. 1 Inhaler 5  . cariprazine (VRAYLAR) capsule Take 1 capsule (1.5 mg total) by mouth daily. (Patient taking differently: Take 3 mg by mouth daily. ) 30 capsule 0  . clonazePAM (KLONOPIN) 1 MG tablet Take 1 mg by mouth 3 (three) times daily as needed.    . Levonorgestrel (KYLEENA) 19.5 MG IUD Kyleena 17.5 mcg/24 hour (5 years) intrauterine device  Take 1 device by intrauterine route.    . famotidine (PEPCID) 20 MG tablet Take 1 tablet (20 mg total) by mouth 2 (two) times daily. 60 tablet 5  . fexofenadine (ALLEGRA) 180 MG tablet Take 1 tablet (180 mg total) by mouth daily. 90 tablet 2  No current facility-administered medications for this visit.     Known medication allergies: No Known Allergies   Physical examination: Blood pressure 102/70, pulse 90, temperature 98.4 F (36.9 C), temperature source Temporal, resp. rate 18, height 5' 4.5" (1.638 m), weight 195 lb 6.4 oz (88.6 kg), SpO2 97 %.  General: Alert, interactive, in no acute distress. HEENT: PERRLA, TMs pearly gray, turbinates mildly edematous without discharge, post-pharynx non erythematous. Neck: Supple without lymphadenopathy. Lungs: Mildly decreased breath sounds with expiratory wheezing  bilaterally. {no increased work of breathing. CV: Normal S1, S2 without murmurs. Abdomen: Nondistended, nontender. Skin: Warm and dry, without lesions or rashes. Extremities:  No clubbing, cyanosis or edema. Neuro:   Grossly intact.  Diagnositics/Labs: She was provided with 4 puffs of albuterol inhaler in the office as well as 20 mg of prednisone in the office.  Assessment and plan:   Mod persistent asthma - with current exacerbation - continue Singulair 10mg  daily - take at bedtime - continue Symbicort 160mg  2 puffs twice a day (grey and red inhaler) - have access to albuterol inhaler 2 puffs every 4-6 hours as needed for cough/wheeze/shortness of breath/chest tightness.  May use 15-20 minutes prior to activity.   Monitor frequency of use.   - take prednisone course as directed by your PCP - will resubmit approval for Xolair and once approved will have you come in to restart injections  Seasonal and perennial allergic rhinitis (trees, weeds, molds, dust mites, dog) - Continue avoidance measures  - Continue with Allegra 180mg  daily as needed (may take additional dose if needed) - Continue with Singulair 10mg  daily.   Chronic urticaria - having nightly symptoms for past 3-4 months - take the following high-dose antihistamine regimen: Allegra 180mg  1 tab twice a day, Pepcid 20mg  1 tab twice a day and Singulair daily until hives improve - will restart Xolair as above which will help with hive control  Recurrent sinopulmonary infections  - will obtain immunocompetence work-up including CBC with differential, CMP, immunoglobulins and vaccine titers and total complement  Return in about 3-4 months or sooner if needed  I appreciate the opportunity to take part in [place] care. Please do not hesitate to contact me with questions.  Sincerely,   Prudy Feeler, MD Allergy/Immunology [facility] [place]

## 2019-07-09 NOTE — Patient Instructions (Addendum)
Mod persistent asthma - with current exacerbation - continue Singulair 10mg  daily - take at bedtime - continue Symbicort 160mg  2 puffs twice a day (grey and red inhaler) - have access to albuterol inhaler 2 puffs every 4-6 hours as needed for cough/wheeze/shortness of breath/chest tightness.  May use 15-20 minutes prior to activity.   Monitor frequency of use.   - take prednisone course as directed by your PCP - will resubmit approval for Xolair and once approved will have you come in to restart injections  Seasonal and perennial allergic rhinitis (trees, weeds, molds, dust mites, dog) - Continue avoidance measures  - Continue with Allegra 180mg  daily as needed (may take additional dose if needed) - Continue with Singulair 10mg  daily.   Chronic urticaria - having nightly symptoms for past 3-4 months - take the following high-dose antihistamine regimen: Allegra 180mg  1 tab twice a day, Pepcid 20mg  1 tab twice a day and Singulair daily until hives improve - will restart Xolair as above which will help with hive control  Recurrent sinopulmonary infections  - will obtain immunocompetence work-up  Return in about 3-4 months or sooner if needed

## 2019-07-14 ENCOUNTER — Other Ambulatory Visit: Payer: Self-pay

## 2019-07-14 MED ORDER — EPINEPHRINE 0.3 MG/0.3ML IJ SOAJ: 0.3000 mg | Freq: Once | INTRAMUSCULAR | 1 refills | Status: DC | PRN

## 2019-07-16 LAB — IGG, IGA, IGM
IgA/Immunoglobulin A, Serum: 144 mg/dL (ref 87–352)
IgG (Immunoglobin G), Serum: 1008 mg/dL (ref 586–1602)
IgM (Immunoglobulin M), Srm: 41 mg/dL (ref 26–217)

## 2019-07-16 LAB — CBC WITH DIFFERENTIAL/PLATELET
Basophils Absolute: 0 10*3/uL (ref 0.0–0.2)
Basos: 1 %
EOS (ABSOLUTE): 0.1 10*3/uL (ref 0.0–0.4)
Eos: 2 %
Hematocrit: 40.5 % (ref 34.0–46.6)
Hemoglobin: 13.3 g/dL (ref 11.1–15.9)
Immature Grans (Abs): 0 10*3/uL (ref 0.0–0.1)
Immature Granulocytes: 0 %
Lymphocytes Absolute: 2.5 10*3/uL (ref 0.7–3.1)
Lymphs: 49 %
MCH: 28.9 pg (ref 26.6–33.0)
MCHC: 32.8 g/dL (ref 31.5–35.7)
MCV: 88 fL (ref 79–97)
Monocytes Absolute: 0.4 10*3/uL (ref 0.1–0.9)
Monocytes: 7 %
Neutrophils Absolute: 2.1 10*3/uL (ref 1.4–7.0)
Neutrophils: 41 %
Platelets: 314 10*3/uL (ref 150–450)
RBC: 4.61 x10E6/uL (ref 3.77–5.28)
RDW: 13.6 % (ref 11.7–15.4)
WBC: 5.1 10*3/uL (ref 3.4–10.8)

## 2019-07-16 LAB — STREP PNEUMONIAE 23 SEROTYPES IGG
Pneumo Ab Type 1*: 4.8 ug/mL
Pneumo Ab Type 12 (12F)*: 0.6 ug/mL — ABNORMAL LOW
Pneumo Ab Type 14*: 27.2 ug/mL
Pneumo Ab Type 17 (17F)*: 16.2 ug/mL
Pneumo Ab Type 19 (19F)*: 3.9 ug/mL
Pneumo Ab Type 2*: >28 ug/mL
Pneumo Ab Type 20*: 5.9 ug/mL
Pneumo Ab Type 22 (22F)*: 4.9 ug/mL
Pneumo Ab Type 23 (23F)*: 2.1 ug/mL
Pneumo Ab Type 26 (6B)*: 1.6 ug/mL
Pneumo Ab Type 3*: 11.9 ug/mL
Pneumo Ab Type 34 (10A)*: 6.7 ug/mL
Pneumo Ab Type 4*: 1.2 ug/mL — ABNORMAL LOW
Pneumo Ab Type 43 (11A)*: 5.3 ug/mL
Pneumo Ab Type 5*: 42.8 ug/mL
Pneumo Ab Type 51 (7F)*: 1.5 ug/mL
Pneumo Ab Type 54 (15B)*: 4.8 ug/mL
Pneumo Ab Type 56 (18C)*: 2.5 ug/mL
Pneumo Ab Type 57 (19A)*: 5.7 ug/mL
Pneumo Ab Type 68 (9V)*: 1.9 ug/mL
Pneumo Ab Type 70 (33F)*: 14.1 ug/mL
Pneumo Ab Type 8*: 4.8 ug/mL
Pneumo Ab Type 9 (9N)*: 7.5 ug/mL

## 2019-07-16 LAB — COMPREHENSIVE METABOLIC PANEL WITH GFR
ALT: 66 [IU]/L — ABNORMAL HIGH (ref 0–32)
AST: 31 [IU]/L (ref 0–40)
Albumin/Globulin Ratio: 2.4 — ABNORMAL HIGH (ref 1.2–2.2)
Albumin: 4.6 g/dL (ref 3.9–5.0)
Alkaline Phosphatase: 77 [IU]/L (ref 39–117)
BUN/Creatinine Ratio: 10 (ref 9–23)
BUN: 8 mg/dL (ref 6–20)
Bilirubin Total: 0.2 mg/dL (ref 0.0–1.2)
CO2: 26 mmol/L (ref 20–29)
Calcium: 9.8 mg/dL (ref 8.7–10.2)
Chloride: 104 mmol/L (ref 96–106)
Creatinine, Ser: 0.77 mg/dL (ref 0.57–1.00)
GFR calc Af Amer: 122 mL/min/{1.73_m2}
GFR calc non Af Amer: 105 mL/min/{1.73_m2}
Globulin, Total: 1.9 g/dL (ref 1.5–4.5)
Glucose: 87 mg/dL (ref 65–99)
Potassium: 4.8 mmol/L (ref 3.5–5.2)
Sodium: 141 mmol/L (ref 134–144)
Total Protein: 6.5 g/dL (ref 6.0–8.5)

## 2019-07-16 LAB — DIPHTHERIA / TETANUS ANTIBODY PANEL
Diphtheria Ab: 0.54 IU/mL (ref <0.10)
Tetanus Ab, IgG: 0.4 IU/mL (ref <0.10)

## 2019-07-16 LAB — COMPLEMENT, TOTAL: Compl, Total (CH50): >60 U/mL (ref >41)

## 2019-07-20 ENCOUNTER — Ambulatory Visit: Payer: BC Managed Care – PPO

## 2019-07-21 DIAGNOSIS — F17200 Nicotine dependence, unspecified, uncomplicated: Secondary | ICD-10-CM | POA: Diagnosis not present

## 2019-07-21 DIAGNOSIS — J4540 Moderate persistent asthma, uncomplicated: Secondary | ICD-10-CM | POA: Diagnosis not present

## 2019-07-21 DIAGNOSIS — F3110 Bipolar disorder, current episode manic without psychotic features, unspecified: Secondary | ICD-10-CM | POA: Diagnosis not present

## 2019-07-21 DIAGNOSIS — J4541 Moderate persistent asthma with (acute) exacerbation: Secondary | ICD-10-CM | POA: Diagnosis not present

## 2019-07-24 DIAGNOSIS — J4540 Moderate persistent asthma, uncomplicated: Secondary | ICD-10-CM | POA: Diagnosis not present

## 2019-07-27 ENCOUNTER — Other Ambulatory Visit: Payer: Self-pay

## 2019-07-27 ENCOUNTER — Ambulatory Visit (INDEPENDENT_AMBULATORY_CARE_PROVIDER_SITE_OTHER): Payer: BC Managed Care – PPO

## 2019-07-27 DIAGNOSIS — Z6832 Body mass index (BMI) 32.0-32.9, adult: Secondary | ICD-10-CM | POA: Diagnosis not present

## 2019-07-27 DIAGNOSIS — Z113 Encounter for screening for infections with a predominantly sexual mode of transmission: Secondary | ICD-10-CM | POA: Diagnosis not present

## 2019-07-27 DIAGNOSIS — Z01419 Encounter for gynecological examination (general) (routine) without abnormal findings: Secondary | ICD-10-CM | POA: Diagnosis not present

## 2019-07-27 DIAGNOSIS — J4541 Moderate persistent asthma with (acute) exacerbation: Secondary | ICD-10-CM

## 2019-07-27 DIAGNOSIS — Z124 Encounter for screening for malignant neoplasm of cervix: Secondary | ICD-10-CM | POA: Diagnosis not present

## 2019-07-27 DIAGNOSIS — J4540 Moderate persistent asthma, uncomplicated: Secondary | ICD-10-CM

## 2019-07-27 MED ORDER — OMALIZUMAB 150 MG ~~LOC~~ SOLR
150.0000 mg | SUBCUTANEOUS | Status: DC
  Administered 2019-07-27 – 2020-03-14 (×6): 150 mg via SUBCUTANEOUS

## 2019-07-27 NOTE — Progress Notes (Signed)
Immunotherapy   Patient Details  Name: Monnie Gudgel MRN: [id number] Date of Birth: Dec 20, 1990  07/27/2019 Patient started Xolair today. Patient will receive 150mg  every 4 weeks. Patient waited 30 minutes prior to leaving. No adverse finding noted. Consent signed and patient instructions given. Patient will pick up her EpiPen from [facility] today.    10-28-1968 07/27/2019, 3:33 PM

## 2019-07-31 ENCOUNTER — Ambulatory Visit: Payer: BC Managed Care – PPO

## 2019-08-23 DIAGNOSIS — F10239 Alcohol dependence with withdrawal, unspecified: Secondary | ICD-10-CM | POA: Diagnosis not present

## 2019-08-24 ENCOUNTER — Ambulatory Visit: Payer: Self-pay

## 2019-09-08 ENCOUNTER — Telehealth: Payer: Self-pay

## 2019-09-08 NOTE — Telephone Encounter (Signed)
Morrie Sheldon with [employer] called regarding a one time dose of Xolair for the patient. She is currently undergoing inpatient rehab at their facility. She wanted to see if they would be able to get one dose of Xolair shipped to them to be administered to the patient. Morrie Sheldon may be react at [phone] ext [phone].

## 2019-09-08 NOTE — Telephone Encounter (Signed)
I had spoken to someone from center and patient yesterday and explained that we cannot send dose since her medication approval is linked to [place] and buy and bill and we have to administer in order to bill for the medication and get paid by Ins.  She can reach out to [place] when she gets out and make appt to come back in and get her injections

## 2019-09-16 ENCOUNTER — Ambulatory Visit: Payer: Self-pay

## 2019-09-17 ENCOUNTER — Ambulatory Visit: Payer: Self-pay

## 2019-09-21 ENCOUNTER — Telehealth (HOSPITAL_COMMUNITY): Payer: Self-pay | Admitting: Psychiatry

## 2019-09-21 NOTE — Telephone Encounter (Signed)
D:  Dana from [employer] called to refer pt to CD-IOP. A:  Provided info about CD-IOP to [facility].  Pt has appt with Fulton Reek, LCSW, LCAS on 10-01-19 @ 9 a.m (informed to arrive at 8:30 a.m., wearing a mask, and bring insurance card).  Inform Waldon Merl.

## 2019-09-21 NOTE — Telephone Encounter (Signed)
Attempted to call patient to reschedule appointment, phone number on file is not a working number.

## 2019-09-25 DIAGNOSIS — F3111 Bipolar disorder, current episode manic without psychotic features, mild: Secondary | ICD-10-CM | POA: Diagnosis not present

## 2019-09-25 DIAGNOSIS — R7303 Prediabetes: Secondary | ICD-10-CM | POA: Diagnosis not present

## 2019-09-25 DIAGNOSIS — F4310 Post-traumatic stress disorder, unspecified: Secondary | ICD-10-CM | POA: Diagnosis not present

## 2019-09-25 DIAGNOSIS — F900 Attention-deficit hyperactivity disorder, predominantly inattentive type: Secondary | ICD-10-CM | POA: Diagnosis not present

## 2019-09-25 DIAGNOSIS — F3131 Bipolar disorder, current episode depressed, mild: Secondary | ICD-10-CM | POA: Diagnosis not present

## 2019-09-25 DIAGNOSIS — E669 Obesity, unspecified: Secondary | ICD-10-CM | POA: Diagnosis not present

## 2019-09-25 DIAGNOSIS — Z1152 Encounter for screening for COVID-19: Secondary | ICD-10-CM | POA: Diagnosis not present

## 2019-09-25 DIAGNOSIS — R635 Abnormal weight gain: Secondary | ICD-10-CM | POA: Diagnosis not present

## 2019-09-28 DIAGNOSIS — J4541 Moderate persistent asthma with (acute) exacerbation: Secondary | ICD-10-CM | POA: Diagnosis not present

## 2019-09-28 DIAGNOSIS — J4520 Mild intermittent asthma, uncomplicated: Secondary | ICD-10-CM | POA: Diagnosis not present

## 2019-09-28 DIAGNOSIS — J3089 Other allergic rhinitis: Secondary | ICD-10-CM | POA: Diagnosis not present

## 2019-09-28 DIAGNOSIS — L501 Idiopathic urticaria: Secondary | ICD-10-CM

## 2019-09-28 DIAGNOSIS — J302 Other seasonal allergic rhinitis: Secondary | ICD-10-CM | POA: Diagnosis not present

## 2019-09-29 ENCOUNTER — Ambulatory Visit (INDEPENDENT_AMBULATORY_CARE_PROVIDER_SITE_OTHER): Payer: BC Managed Care – PPO

## 2019-09-29 ENCOUNTER — Encounter: Payer: Self-pay | Admitting: Allergy & Immunology

## 2019-09-29 ENCOUNTER — Ambulatory Visit: Payer: BC Managed Care – PPO | Admitting: Allergy & Immunology

## 2019-09-29 ENCOUNTER — Other Ambulatory Visit: Payer: Self-pay

## 2019-09-29 VITALS — BP 118/80 | HR 114 | Temp 97.3°F | Ht 64.5 in | Wt 212.0 lb

## 2019-09-29 DIAGNOSIS — J302 Other seasonal allergic rhinitis: Secondary | ICD-10-CM

## 2019-09-29 DIAGNOSIS — J4541 Moderate persistent asthma with (acute) exacerbation: Secondary | ICD-10-CM | POA: Diagnosis not present

## 2019-09-29 DIAGNOSIS — L508 Other urticaria: Secondary | ICD-10-CM

## 2019-09-29 DIAGNOSIS — J3089 Other allergic rhinitis: Secondary | ICD-10-CM | POA: Diagnosis not present

## 2019-09-29 DIAGNOSIS — L501 Idiopathic urticaria: Secondary | ICD-10-CM | POA: Diagnosis not present

## 2019-09-29 MED ORDER — METHYLPREDNISOLONE ACETATE 40 MG/ML IJ SUSP: 40.0000 mg | Freq: Once | INTRAMUSCULAR | Status: DC

## 2019-09-29 MED ORDER — METHYLPREDNISOLONE ACETATE 80 MG/ML IJ SUSP
80.0000 mg | Freq: Once | INTRAMUSCULAR | Status: AC
  Administered 2019-09-29: 40 mg via INTRAMUSCULAR

## 2019-09-29 NOTE — Patient Instructions (Addendum)
1. Moderate persistent asthma with acute exacerbation - Lung function looks good today, but you were wheezing on exam.  - We did give you a Depo shot today. - Spacer sample and demonstration  - Daily controller medication(s): Singulair 10mg  daily and Symbicort 160/4.16mcg two puffs twice daily with spacer - Prior to physical activity: albuterol 2 puffs 10-15 minutes before physical activity. - Rescue medications: albuterol 4 puffs every 4-6 hours as needed - Asthma control goals:  * Full participation in all desired activities (may need albuterol before activity) * Albuterol use two time or less a week on average (not counting use with activity) * Cough interfering with sleep two time or less a month * Oral steroids no more than once a year * No hospitalizations  2. Seasonal and perennial allergic rhinitis - Continue with nasal saline rinses.  - Continue with the antihistamines for your hives.   3. Chronic urticaria - Continue with Allegra twice daily. - Continue with Pepcid twice daily. - Continue with Xolair monthly (we can increase to every two weeks if you continue to have breakthrough hives).  4. Return in about 3 months (around 12/30/2019). This can be an in-person, a virtual Webex or a telephone follow up visit.   Please inform 01/01/2020 of any Emergency Department visits, hospitalizations, or changes in symptoms. Call us before going to the ED for breathing or allergy symptoms since we might be able to fit you in for a sick visit. Feel free to contact us anytime with any questions, problems, or concerns.  It was a pleasure to see you again today!  Websites that have reliable patient information: 1. American Academy of Asthma, Allergy, and Immunology: www.aaaai.org 2. Food Allergy Research and Education (FARE): foodallergy.org 3. Mothers of Asthmatics: http://www.asthmacommunitynetwork.org 4. American College of Allergy, Asthma, and Immunology: www.acaai.org   COVID-19 Vaccine  Information can be found at: [place] For questions related to vaccine distribution or appointments, please email [email or web address] or call [phone].     "Like" [phone] on Facebook and Instagram for our latest updates!       HAPPY SPRING!  Make sure you are registered to vote! If you have moved or changed any of your contact information, you will need to get this updated before voting!  In some cases, you MAY be able to register to vote online: [place]

## 2019-09-29 NOTE — Progress Notes (Signed)
FOLLOW UP  Date of Service/Encounter:  09/29/19   Assessment:   Seasonal and perennial allergic rhinitis (trees, weeds, molds, dust mites, dog)  Chronic urticaria - improved on Xolair   Moderate persistent asthma with acute exacerbation   Dwanna comes in with an asthma exacerbation.  She is clearly wheezing on exam and does improve with the nebulizer treatment.  We did give her a Depo-Medrol injection.  I think the majority of her current clinical status is related to her belated Xolair injections.  She did get one today.  I also provided her with a spacer to help distribute the Symbicort more effectively into her lungs.  We could consider changing Biologics, but I do not think Xolair has not enough time to work.  Plan/Recommendations:    1. Moderate persistent asthma with acute exacerbation - Lung function looks good today, but you were wheezing on exam.  - We did give you a Depo shot today. - Spacer sample and demonstration  - Daily controller medication(s): Singulair 10mg  daily and Symbicort 160/4.11mcg two puffs twice daily with spacer - Prior to physical activity: albuterol 2 puffs 10-15 minutes before physical activity. - Rescue medications: albuterol 4 puffs every 4-6 hours as needed - Asthma control goals:  * Full participation in all desired activities (may need albuterol before activity) * Albuterol use two time or less a week on average (not counting use with activity) * Cough interfering with sleep two time or less a month * Oral steroids no more than once a year * No hospitalizations  2. Seasonal and perennial allergic rhinitis - Continue with nasal saline rinses.  - Continue with the antihistamines for your hives.   3. Chronic urticaria - Continue with Allegra twice daily. - Continue with Pepcid twice daily. - Continue with Xolair monthly (we can increase to every two weeks if you continue to have breakthrough hives).  4. Return in about 3 months (around  12/30/2019). This can be an in-person, a virtual Webex or a telephone follow up visit.   Subjective:   Keilly Fatula is a 29 y.o. female presenting today for follow up of  Chief Complaint  Patient presents with  . Allergic Rhinitis   . Asthma    Nahal Palinkas has a history of the following: Patient Active Problem List   Diagnosis Date Noted  . Bipolar I disorder with depression, severe (HCC) 03/22/2019    History obtained from: chart review and patient.  Orianna is a 29 y.o. female presenting for a follow up visit. She was last seen in January 2021 by Dr. February 2021. At that time, she was continued on Singulair 10mg  daily as well as Symbicort Delorse Lek two puffs BID.  She was also having chronic urticaria.  She was started on Allegra 180 mg twice daily as well as Pepcid 20 mg twice daily and Singulair daily.  She was restarted on Xolair.  For her history of recurrent sinopulmonary infections, she had an immune deficiency work-up.   Her lab work-up was remarkable only for mild elevation in her ALT which was improving over time.  Immunoglobulins, CBC, and vaccine titers were normal.  Since last visit, she has not done well.  She has had a lot of issues over the last 2 to 3 weeks.  Asthma/Respiratory Symptom History: She is on the Symbicort two puffs every 4-6 hours as needed. She alsways has issues during the time of the year when the seasons changed. She does have a rescue inhaler but she thinks  that the majority of her allergies are "environemental".  Allergic Rhinitis Symptom History: She is on the Allegra and the Pepcid. She is not on allergy shots. She has not needed prednisone in quite some time.   Eczema Symptom History: She remains on the Xolair. She is slightly delayed on her xolair and his hives have broken out. She is on her Allegra only once daily. She is on the Singulair daily. She is also on the famotidine twice daily. She is no on Dupixent and has never been on a biologic in the past  for her symptoms.   Otherwise, there have been no changes to her past medical history, surgical history, family history, or social history.    Review of Systems  Constitutional: Negative.  Negative for chills, fever, malaise/fatigue and weight loss.  HENT: Negative.  Negative for congestion, ear discharge and ear pain.   Eyes: Negative for pain, discharge and redness.  Respiratory: Positive for cough, shortness of breath and wheezing. Negative for sputum production.   Cardiovascular: Negative.  Negative for chest pain, palpitations, orthopnea and claudication.  Gastrointestinal: Negative for abdominal pain, constipation, diarrhea, heartburn, nausea and vomiting.  Skin: Negative.  Negative for itching and rash.  Neurological: Negative for dizziness, tingling, tremors, sensory change and headaches.  Endo/Heme/Allergies: Negative for environmental allergies. Does not bruise/bleed easily.       Objective:   Blood pressure 118/80, pulse (!) 114, temperature (!) 97.3 F (36.3 C), temperature source Temporal, height 5' 4.5" (1.638 m), weight 212 lb (96.2 kg), SpO2 97 %. Body mass index is 35.83 kg/m.   Physical Exam:  Physical Exam  Constitutional: She appears well-developed.  Pleasant female. Smells of pot.   HENT:  Head: Normocephalic and atraumatic.  Right Ear: Tympanic membrane, external ear and ear canal normal.  Left Ear: Tympanic membrane, external ear and ear canal normal.  Nose: Mucosal edema and rhinorrhea present. No nasal deformity or septal deviation. No epistaxis. Right sinus exhibits no maxillary sinus tenderness and no frontal sinus tenderness. Left sinus exhibits no maxillary sinus tenderness and no frontal sinus tenderness.  Mouth/Throat: Uvula is midline and oropharynx is clear and moist. Mucous membranes are not pale and not dry.  Turbinates enlarged bilaterally with clear discharge.   Eyes: Pupils are equal, round, and reactive to light. Conjunctivae and EOM are  normal. Right eye exhibits no chemosis and no discharge. Left eye exhibits no chemosis and no discharge. Right conjunctiva is not injected. Left conjunctiva is not injected.  Cardiovascular: Normal rate, regular rhythm and normal heart sounds.  Respiratory: Effort normal and breath sounds normal. No accessory muscle usage. No tachypnea. No respiratory distress. She has no wheezes. She has no rhonchi. She has no rales. She exhibits no tenderness.  Expiratory wheezing bilaterally.  No crackles. Decreased air movement at the bases.   Lymphadenopathy:    She has no cervical adenopathy.  Neurological: She is alert.  Skin: No abrasion, no petechiae and no rash noted. Rash is not papular, not vesicular and not urticarial. No erythema. No pallor.  No eczematous or urticarial lesions noted.   Psychiatric: She has a normal mood and affect.     Diagnostic studies:    Spirometry: results normal (FEV1: 2.11/76%, FVC: 2.66/82%, FEV1/FVC: 79%).    Spirometry consistent with normal pattern.   Allergy Studies: none        Salvatore Marvel, MD  [facility]

## 2019-10-01 ENCOUNTER — Other Ambulatory Visit: Payer: Self-pay

## 2019-10-01 ENCOUNTER — Ambulatory Visit (HOSPITAL_COMMUNITY): Payer: Federal, State, Local not specified - Other | Admitting: Licensed Clinical Social Worker

## 2019-10-02 ENCOUNTER — Ambulatory Visit (HOSPITAL_COMMUNITY): Payer: Federal, State, Local not specified - Other | Admitting: Licensed Clinical Social Worker

## 2019-10-02 ENCOUNTER — Telehealth (HOSPITAL_COMMUNITY): Payer: Self-pay | Admitting: Licensed Clinical Social Worker

## 2019-10-02 NOTE — Telephone Encounter (Signed)
No show for intake appointment for CDIOP on 10/01/19 and 10/02/19 following discharge from residential program. Client may reschedule.

## 2019-10-07 ENCOUNTER — Ambulatory Visit: Payer: BC Managed Care – PPO | Admitting: Allergy

## 2019-10-27 ENCOUNTER — Ambulatory Visit: Payer: Self-pay

## 2019-11-26 ENCOUNTER — Ambulatory Visit: Payer: Self-pay

## 2019-12-14 DIAGNOSIS — J4540 Moderate persistent asthma, uncomplicated: Secondary | ICD-10-CM | POA: Diagnosis not present

## 2019-12-15 ENCOUNTER — Ambulatory Visit (INDEPENDENT_AMBULATORY_CARE_PROVIDER_SITE_OTHER): Payer: BC Managed Care – PPO

## 2019-12-15 ENCOUNTER — Other Ambulatory Visit: Payer: Self-pay

## 2019-12-15 DIAGNOSIS — J4541 Moderate persistent asthma with (acute) exacerbation: Secondary | ICD-10-CM

## 2019-12-15 DIAGNOSIS — J4540 Moderate persistent asthma, uncomplicated: Secondary | ICD-10-CM | POA: Diagnosis not present

## 2020-01-11 DIAGNOSIS — J4540 Moderate persistent asthma, uncomplicated: Secondary | ICD-10-CM | POA: Diagnosis not present

## 2020-01-12 ENCOUNTER — Ambulatory Visit (INDEPENDENT_AMBULATORY_CARE_PROVIDER_SITE_OTHER): Payer: BC Managed Care – PPO

## 2020-01-12 ENCOUNTER — Other Ambulatory Visit: Payer: Self-pay

## 2020-01-12 DIAGNOSIS — J4540 Moderate persistent asthma, uncomplicated: Secondary | ICD-10-CM | POA: Diagnosis not present

## 2020-01-12 DIAGNOSIS — J4541 Moderate persistent asthma with (acute) exacerbation: Secondary | ICD-10-CM

## 2020-01-18 DIAGNOSIS — Z6836 Body mass index (BMI) 36.0-36.9, adult: Secondary | ICD-10-CM | POA: Diagnosis not present

## 2020-01-18 DIAGNOSIS — R7303 Prediabetes: Secondary | ICD-10-CM | POA: Diagnosis not present

## 2020-01-18 DIAGNOSIS — R635 Abnormal weight gain: Secondary | ICD-10-CM | POA: Diagnosis not present

## 2020-01-18 DIAGNOSIS — R5382 Chronic fatigue, unspecified: Secondary | ICD-10-CM | POA: Diagnosis not present

## 2020-01-18 DIAGNOSIS — N951 Menopausal and female climacteric states: Secondary | ICD-10-CM | POA: Diagnosis not present

## 2020-01-18 DIAGNOSIS — E559 Vitamin D deficiency, unspecified: Secondary | ICD-10-CM | POA: Diagnosis not present

## 2020-01-19 DIAGNOSIS — R7303 Prediabetes: Secondary | ICD-10-CM | POA: Diagnosis not present

## 2020-01-27 DIAGNOSIS — R6882 Decreased libido: Secondary | ICD-10-CM | POA: Diagnosis not present

## 2020-01-27 DIAGNOSIS — Z1339 Encounter for screening examination for other mental health and behavioral disorders: Secondary | ICD-10-CM | POA: Diagnosis not present

## 2020-01-27 DIAGNOSIS — R5382 Chronic fatigue, unspecified: Secondary | ICD-10-CM | POA: Diagnosis not present

## 2020-01-27 DIAGNOSIS — N951 Menopausal and female climacteric states: Secondary | ICD-10-CM | POA: Diagnosis not present

## 2020-01-27 DIAGNOSIS — Z1331 Encounter for screening for depression: Secondary | ICD-10-CM | POA: Diagnosis not present

## 2020-01-27 DIAGNOSIS — F5101 Primary insomnia: Secondary | ICD-10-CM | POA: Diagnosis not present

## 2020-01-28 DIAGNOSIS — R7303 Prediabetes: Secondary | ICD-10-CM | POA: Diagnosis not present

## 2020-01-28 DIAGNOSIS — R635 Abnormal weight gain: Secondary | ICD-10-CM | POA: Diagnosis not present

## 2020-01-28 DIAGNOSIS — E669 Obesity, unspecified: Secondary | ICD-10-CM | POA: Diagnosis not present

## 2020-02-01 ENCOUNTER — Ambulatory Visit: Payer: Self-pay | Admitting: Registered"

## 2020-02-08 DIAGNOSIS — J4540 Moderate persistent asthma, uncomplicated: Secondary | ICD-10-CM

## 2020-02-09 ENCOUNTER — Other Ambulatory Visit: Payer: Self-pay

## 2020-02-09 ENCOUNTER — Ambulatory Visit (INDEPENDENT_AMBULATORY_CARE_PROVIDER_SITE_OTHER): Payer: BC Managed Care – PPO

## 2020-02-09 DIAGNOSIS — J4541 Moderate persistent asthma with (acute) exacerbation: Secondary | ICD-10-CM

## 2020-02-09 DIAGNOSIS — F4325 Adjustment disorder with mixed disturbance of emotions and conduct: Secondary | ICD-10-CM | POA: Diagnosis not present

## 2020-02-09 DIAGNOSIS — J4540 Moderate persistent asthma, uncomplicated: Secondary | ICD-10-CM | POA: Diagnosis not present

## 2020-02-11 DIAGNOSIS — Z720 Tobacco use: Secondary | ICD-10-CM | POA: Diagnosis not present

## 2020-02-16 DIAGNOSIS — F4325 Adjustment disorder with mixed disturbance of emotions and conduct: Secondary | ICD-10-CM | POA: Diagnosis not present

## 2020-02-23 DIAGNOSIS — F4325 Adjustment disorder with mixed disturbance of emotions and conduct: Secondary | ICD-10-CM | POA: Diagnosis not present

## 2020-03-08 ENCOUNTER — Ambulatory Visit: Payer: Self-pay

## 2020-03-08 DIAGNOSIS — R030 Elevated blood-pressure reading, without diagnosis of hypertension: Secondary | ICD-10-CM | POA: Diagnosis not present

## 2020-03-08 DIAGNOSIS — Z20822 Contact with and (suspected) exposure to covid-19: Secondary | ICD-10-CM | POA: Diagnosis not present

## 2020-03-08 NOTE — Progress Notes (Signed)
error 

## 2020-03-09 ENCOUNTER — Ambulatory Visit: Payer: Self-pay

## 2020-03-11 DIAGNOSIS — L501 Idiopathic urticaria: Secondary | ICD-10-CM | POA: Diagnosis not present

## 2020-03-14 ENCOUNTER — Encounter: Payer: Self-pay | Admitting: Allergy

## 2020-03-14 ENCOUNTER — Other Ambulatory Visit: Payer: Self-pay

## 2020-03-14 ENCOUNTER — Ambulatory Visit (INDEPENDENT_AMBULATORY_CARE_PROVIDER_SITE_OTHER): Payer: BC Managed Care – PPO

## 2020-03-14 DIAGNOSIS — L501 Idiopathic urticaria: Secondary | ICD-10-CM | POA: Diagnosis not present

## 2020-03-16 DIAGNOSIS — F4310 Post-traumatic stress disorder, unspecified: Secondary | ICD-10-CM | POA: Diagnosis not present

## 2020-03-16 DIAGNOSIS — F3174 Bipolar disorder, in full remission, most recent episode manic: Secondary | ICD-10-CM | POA: Diagnosis not present

## 2020-03-16 DIAGNOSIS — F3176 Bipolar disorder, in full remission, most recent episode depressed: Secondary | ICD-10-CM | POA: Diagnosis not present

## 2020-03-16 DIAGNOSIS — F900 Attention-deficit hyperactivity disorder, predominantly inattentive type: Secondary | ICD-10-CM | POA: Diagnosis not present

## 2020-03-21 ENCOUNTER — Ambulatory Visit: Payer: Medicaid Other | Admitting: Registered"

## 2020-03-25 ENCOUNTER — Ambulatory Visit (INDEPENDENT_AMBULATORY_CARE_PROVIDER_SITE_OTHER): Payer: BC Managed Care – PPO | Admitting: Family

## 2020-03-25 ENCOUNTER — Other Ambulatory Visit: Payer: Self-pay

## 2020-03-25 ENCOUNTER — Encounter: Payer: Self-pay | Admitting: Family

## 2020-03-25 ENCOUNTER — Telehealth: Payer: Self-pay

## 2020-03-25 DIAGNOSIS — J302 Other seasonal allergic rhinitis: Secondary | ICD-10-CM | POA: Diagnosis not present

## 2020-03-25 DIAGNOSIS — J4541 Moderate persistent asthma with (acute) exacerbation: Secondary | ICD-10-CM | POA: Diagnosis not present

## 2020-03-25 DIAGNOSIS — J3089 Other allergic rhinitis: Secondary | ICD-10-CM

## 2020-03-25 DIAGNOSIS — L501 Idiopathic urticaria: Secondary | ICD-10-CM

## 2020-03-25 DIAGNOSIS — J45909 Unspecified asthma, uncomplicated: Secondary | ICD-10-CM | POA: Diagnosis not present

## 2020-03-25 MED ORDER — ALBUTEROL SULFATE (2.5 MG/3ML) 0.083% IN NEBU: 2.5000 mg | INHALATION_SOLUTION | RESPIRATORY_TRACT | 2 refills | Status: DC | PRN

## 2020-03-25 MED ORDER — SPIRIVA RESPIMAT 1.25 MCG/ACT IN AERS: INHALATION_SPRAY | RESPIRATORY_TRACT | 5 refills | Status: DC

## 2020-03-25 NOTE — Telephone Encounter (Signed)
Thank you noted.

## 2020-03-25 NOTE — Patient Instructions (Addendum)
Moderate persistent asthma with acute exacerbation Start prednisone 10 mg take 2 tablets twice a day for 3 days, then on the fourth day take 2 tablets in the morning, on the fifth day take 1 tablet and stop. Start Spiriva Respimat 1.25 mcg using 2 puffs once a day to help prevent cough and wheeze Continue Symbicort 160/4.5  2 puffs twice a day with spacer to help prevent cough and wheeze Decrease Singulair to10 mg once a day to help prevent cough and wheeze. Do not take twice a day May use albuterol 2 puffs every 4 hours as needed for cough, wheeze, tightness in chest, shortness of breath OR may use albuterol 0.083% - use 1 unit dose via nebulizer every 4 hours as needed for cough, wheeze, tightness in chest, or shortness of breath.  Also may use albuterol 2 puffs 5 to 15 minutes prior to exercise Come by our [facility] office to pick up a nebulizer Asthma control goals:   Full participation in all desired activities (may need albuterol before activity)  Albuterol use two time or less a week on average (not counting use with activity)  Cough interfering with sleep two time or less a month  Oral steroids no more than once a year  No hospitalizations If your symptoms persist or worsen please proceed to the emergency room  Seasonal and perennial allergic rhinitis Continue antihistamines as listed below. May use saline nasal rinse as needed for nasal symptoms  Chronic urticaria Continue Xolair 150 mg injections every 28 days Continue Allegra twice a day to help with itching Continue Pepcid 20 mg twice a day to help with itching We will see if we can get your Xolair approved for every 14 days  Please let us know if this treatment plan is not working well for you  Keep already scheduled follow-up appointment on 04/05/20 with Dr. Dellis Anes

## 2020-03-25 NOTE — Progress Notes (Signed)
RE: Claudia Winters MRN: [id number] DOB: 1990/07/30 Date of Telemedicine Visit: 03/25/2020  Referring provider: Ileana Ladd, MD Primary care provider: Ileana Ladd, MD  Chief Complaint: Asthma (03/19/2020), Sinus Problem, and Urticaria (wants to increase on Xolair)   Telemedicine Follow Up Visit via Telephone: I connected with Claudia Winters for a follow up on 03/25/20 by telephone and verified that I am speaking with the correct person using two identifiers.   I discussed the limitations, risks, security and privacy concerns of performing an evaluation and management service by telephone and the availability of in person appointments. I also discussed with the patient that there may be a patient responsible charge related to this service. The patient expressed understanding and agreed to proceed.  Patient is at home .  Provider is at the office.  Visit start time: 1:27 PM Visit end time: 1:50 PM Insurance consent/check in by: Dyasia N Medical consent and medical assistant/nurse: Irwin Brakeman  History of Present Illness: She is a 29 y.o. female, who is being followed for seasonal and perennial allergic rhinitis, chronic urticaria, and moderate persistent asthma. Her previous allergy office visit was on September 29, 2019 with Dr. Dellis Anes.   Moderate persistent asthma is reported as not well controlled with Symbicort 160/4.5 mcg 2 puffs twice a day with spacer, Singulair 10 mg 1 tablet twice a day, and albuterol as needed. Her symptoms started 1-2 weeks ago.  She reports that she usually has problems with her asthma and allergies this time of the year.  She reports a productive cough that is constant.  The color of her sputum is clear.  She also reports shortness of breath, tightness in her chest and a little bit of wheezing.  When asked about nocturnal awakenings she reports that they have not occurred often.  She denies any fevers, chills, or body aches.  For the past 2 weeks she has been using  her albuterol 2-3 times a day.  Since her last office visit she has not required any systemic steroids or made any trips to the emergency room or urgent care.  She is interested in receiving steroids today and a breathing treatment.  She does not have a nebulizer at home.  Seasonal and perennial allergic rhinitis is reported as moderately controlled with Nasonex once a day and Allegra twice a day.  She is currently not using any saline rinses.  She reports nasal congestion, postnasal drip and itchy watery eyes.  She reports that she uses saline eyedrops since she wears contacts.  She reports that her hives are currently flaring at the moment on her left wrist and hand while taking Xolair 150 mg injection every 4 weeks, Allegra 180 mg twice a day, and Pepcid 20 mg twice a day.  She is interested in increasing her Xolair injections to every 2 weeks if possible.  Current medications are as listed in the chart.  Assessment and Plan: Claudia Winters is a 29 y.o. female with: Patient Instructions  Moderate persistent asthma with acute exacerbation Start prednisone 10 mg take 2 tablets twice a day for 3 days, then on the fourth day take 2 tablets in the morning, on the fifth day take 1 tablet and stop. Start Spiriva Respimat 1.25 mcg using 2 puffs once a day to help prevent cough and wheeze Continue Symbicort 160/4.5  2 puffs twice a day with spacer to help prevent cough and wheeze Decrease Singulair to10 mg once a day to help prevent cough and wheeze. Do not  take twice a day May use albuterol 2 puffs every 4 hours as needed for cough, wheeze, tightness in chest, shortness of breath OR may use albuterol 0.083% - use 1 unit dose via nebulizer every 4 hours as needed for cough, wheeze, tightness in chest, or shortness of breath.  Also may use albuterol 2 puffs 5 to 15 minutes prior to exercise Come by our [facility] office to pick up a nebulizer Asthma control goals:   Full participation in all desired activities  (may need albuterol before activity)  Albuterol use two time or less a week on average (not counting use with activity)  Cough interfering with sleep two time or less a month  Oral steroids no more than once a year  No hospitalizations If your symptoms persist or worsen please proceed to the emergency room  Seasonal and perennial allergic rhinitis Continue antihistamines as listed below. May use saline nasal rinse as needed for nasal symptoms  Chronic urticaria Continue Xolair 150 mg injections every 28 days Continue Allegra twice a day to help with itching Continue Pepcid 20 mg twice a day to help with itching We will see if we can get your Xolair approved for every 14 days  Please let us know if this treatment plan is not working well for you  Keep already scheduled follow-up appointment on 04/05/20 with Dr. Dellis Anes   Return in about 11 days (around 04/05/2020), or if symptoms worsen or fail to improve.  No orders of the defined types were placed in this encounter.  Lab Orders  No laboratory test(s) ordered today    Diagnostics: None.  Medication List:  Current Outpatient Medications  Medication Sig Dispense Refill  . albuterol (PROAIR HFA) 108 (90 Base) MCG/ACT inhaler Inhale 1-2 puffs into the lungs every 6 (six) hours as needed for wheezing or shortness of breath. 18 g 1  . amphetamine-dextroamphetamine (ADDERALL XR) 20 MG 24 hr capsule Take 2 capsules by mouth in the morning.    . budesonide-formoterol (SYMBICORT) 160-4.5 MCG/ACT inhaler Inhale 2 puffs into the lungs 2 (two) times daily. 1 Inhaler 5  . cariprazine (VRAYLAR) capsule Take 1 capsule (1.5 mg total) by mouth daily. (Patient taking differently: Take 3 mg by mouth daily. ) 30 capsule 0  . clonazePAM (KLONOPIN) 1 MG tablet Take 1 mg by mouth 3 (three) times daily as needed.    Marland Kitchen EPINEPHrine 0.3 mg/0.3 mL IJ SOAJ injection Inject 0.3 mLs (0.3 mg total) into the muscle once as needed for anaphylaxis. 2 each 1   . famotidine (PEPCID) 20 MG tablet Take 1 tablet (20 mg total) by mouth 2 (two) times daily. 60 tablet 5  . fexofenadine (ALLEGRA) 180 MG tablet Take 1 tablet (180 mg total) by mouth daily. 90 tablet 2  . Levonorgestrel (KYLEENA) 19.5 MG IUD Kyleena 17.5 mcg/24 hour (5 years) intrauterine device  Take 1 device by intrauterine route.    . metFORMIN (GLUCOPHAGE) 500 MG tablet Take by mouth.    . montelukast (SINGULAIR) 10 MG tablet TAKE 2 TABLETS EVERY EVENING ORALLY 90 DAYS    . QUEtiapine (SEROQUEL) 100 MG tablet Take 100 mg by mouth at bedtime.     Current Facility-Administered Medications  Medication Dose Route Frequency Provider Last Rate Last Admin  . omalizumab Geoffry Paradise) injection 150 mg  150 mg Subcutaneous Q28 days Marcelyn Bruins, MD   150 mg at 03/14/20 [id number]   Allergies: No Known Allergies I reviewed her past medical history, social history, family history, and  environmental history and no significant changes have been reported from previous visit on 09/29/2019.  Review of Systems  Constitutional: Negative for chills and fever.  HENT: Positive for congestion and postnasal drip. Negative for rhinorrhea.   Eyes:       Reports itchy eyes  Respiratory: Positive for cough, shortness of breath and wheezing.   Cardiovascular: Negative for chest pain and palpitations.  Gastrointestinal: Negative for abdominal pain.       Reports heartburn  Genitourinary: Negative for dysuria.  Skin:       Reports urticaria on left wrist and hand  Allergic/Immunologic: Positive for environmental allergies.  Neurological: Negative for headaches.   Objective: Physical Exam Not obtained as encounter was done via telephone.   Previous notes and tests were reviewed.  I discussed the assessment and treatment plan with the patient. The patient was provided an opportunity to ask questions and all were answered. The patient agreed with the plan and demonstrated an understanding of the  instructions.   The patient was advised to call back or seek an in-person evaluation if the symptoms worsen or if the condition fails to improve as anticipated.  I provided 23 minutes of non-face-to-face time during this encounter.  It was my pleasure to participate in Zuzanna Eklund's care today. Please feel free to contact me with any questions or concerns.   Sincerely,  Nehemiah Settle, FNP

## 2020-03-25 NOTE — Telephone Encounter (Signed)
Patient called to say that she was having chest tightness, sinus headache, wheezing since last week Saturday while at work. Patient believes that the last time she was here with the same issue she was given a breathing treatment as well as a steroid injection. She has used her rescue inhaler several times since this attack has stared. She says every time the season changes she has trouble with her asthma. She was made an appointment for today with Nehemiah Settle at 1:30 pm today for a telephone visit.

## 2020-03-29 ENCOUNTER — Telehealth: Payer: Self-pay | Admitting: *Deleted

## 2020-03-29 DIAGNOSIS — L501 Idiopathic urticaria: Secondary | ICD-10-CM

## 2020-03-29 MED ORDER — OMALIZUMAB 150 MG ~~LOC~~ SOLR
300.0000 mg | SUBCUTANEOUS | Status: AC
  Administered 2020-04-15 – 2021-02-24 (×4): 300 mg via SUBCUTANEOUS

## 2020-03-29 NOTE — Telephone Encounter (Signed)
-----   Message from Nehemiah Settle, FNP sent at 03/25/2020  3:24 PM EDT ----- Can you please see if she is eligible to receive her Xolair injections every two weeks due to her idiopathic urticaria?Thank you,Chrissie

## 2020-03-29 NOTE — Telephone Encounter (Signed)
Called patient and advised increase to 300mg  every 28 days to see how it controls her hives.  This plan okay with Chrissy

## 2020-04-04 DIAGNOSIS — F4325 Adjustment disorder with mixed disturbance of emotions and conduct: Secondary | ICD-10-CM | POA: Diagnosis not present

## 2020-04-05 ENCOUNTER — Ambulatory Visit: Payer: BC Managed Care – PPO | Admitting: Allergy & Immunology

## 2020-04-11 ENCOUNTER — Ambulatory Visit: Payer: Self-pay

## 2020-04-14 DIAGNOSIS — L501 Idiopathic urticaria: Secondary | ICD-10-CM | POA: Diagnosis not present

## 2020-04-15 ENCOUNTER — Ambulatory Visit (INDEPENDENT_AMBULATORY_CARE_PROVIDER_SITE_OTHER): Payer: BC Managed Care – PPO

## 2020-04-15 ENCOUNTER — Other Ambulatory Visit: Payer: Self-pay

## 2020-04-15 ENCOUNTER — Ambulatory Visit: Payer: Self-pay

## 2020-04-15 DIAGNOSIS — L501 Idiopathic urticaria: Secondary | ICD-10-CM

## 2020-04-20 ENCOUNTER — Other Ambulatory Visit: Payer: Self-pay | Admitting: Allergy

## 2020-05-12 DIAGNOSIS — L501 Idiopathic urticaria: Secondary | ICD-10-CM | POA: Diagnosis not present

## 2020-05-13 ENCOUNTER — Other Ambulatory Visit: Payer: Self-pay

## 2020-05-13 ENCOUNTER — Ambulatory Visit (INDEPENDENT_AMBULATORY_CARE_PROVIDER_SITE_OTHER): Payer: BC Managed Care – PPO

## 2020-05-13 DIAGNOSIS — L501 Idiopathic urticaria: Secondary | ICD-10-CM

## 2020-05-30 DIAGNOSIS — F4325 Adjustment disorder with mixed disturbance of emotions and conduct: Secondary | ICD-10-CM | POA: Diagnosis not present

## 2020-06-10 ENCOUNTER — Ambulatory Visit: Payer: Self-pay

## 2020-06-13 DIAGNOSIS — F4325 Adjustment disorder with mixed disturbance of emotions and conduct: Secondary | ICD-10-CM | POA: Diagnosis not present

## 2020-06-20 ENCOUNTER — Encounter: Payer: Self-pay | Admitting: *Deleted

## 2020-06-20 ENCOUNTER — Telehealth: Payer: Self-pay | Admitting: *Deleted

## 2020-06-20 NOTE — Telephone Encounter (Signed)
Per Ins needs documentation on response to Xolair since 300mg  dose. I did sent patient MYchart message that unless we get auth before Jan we will need to discuss change in Rx to pharmacy not buy and bill. Unable to leave message no voicemail setup

## 2020-06-28 ENCOUNTER — Ambulatory Visit (INDEPENDENT_AMBULATORY_CARE_PROVIDER_SITE_OTHER): Payer: BC Managed Care – PPO | Admitting: Allergy & Immunology

## 2020-06-28 ENCOUNTER — Encounter: Payer: Self-pay | Admitting: Allergy & Immunology

## 2020-06-28 ENCOUNTER — Other Ambulatory Visit: Payer: Self-pay

## 2020-06-28 VITALS — BP 120/78 | HR 78 | Temp 97.4°F | Ht 65.0 in | Wt 224.2 lb

## 2020-06-28 DIAGNOSIS — J3089 Other allergic rhinitis: Secondary | ICD-10-CM | POA: Diagnosis not present

## 2020-06-28 DIAGNOSIS — L501 Idiopathic urticaria: Secondary | ICD-10-CM | POA: Diagnosis not present

## 2020-06-28 DIAGNOSIS — J0100 Acute maxillary sinusitis, unspecified: Secondary | ICD-10-CM

## 2020-06-28 DIAGNOSIS — J4541 Moderate persistent asthma with (acute) exacerbation: Secondary | ICD-10-CM | POA: Diagnosis not present

## 2020-06-28 DIAGNOSIS — J302 Other seasonal allergic rhinitis: Secondary | ICD-10-CM

## 2020-06-28 NOTE — Patient Instructions (Addendum)
1. Moderate persistent asthma with acute exacerbation - Lung function not done today, but you did have some wheezing on the left lung fields.  - We are going to change you to Trelegy one puff once daily (replaces Symbicort and Spiriva). - Daily controller medication(s): Singulair 10mg  daily and Trelegy 100/62.5/25 one puff once daily - Prior to physical activity: albuterol 2 puffs 10-15 minutes before physical activity. - Rescue medications: albuterol 4 puffs every 4-6 hours as needed - Asthma control goals:  * Full participation in all desired activities (may need albuterol before activity) * Albuterol use two time or less a week on average (not counting use with activity) * Cough interfering with sleep two time or less a month * Oral steroids no more than once a year * No hospitalizations  2. Seasonal and perennial allergic rhinitis - Continue with nasal saline rinses.  - Continue with the antihistamines for your hives. - Continue with Flonase one spray per nostril twice daily. 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 - Get a HEPA filter for your work area (even one that can just go on your desk to purify the air around you).  3. Acute sinusitis - With your current symptoms and time course, antibiotics are needed: doxycycline 100mg  twice daily for 14 days  - Start the prednisone dose pack provided today.  - Add on nasal saline spray (i.e., Simply Saline) or nasal saline lavage (i.e., NeilMed) as needed prior to medicated nasal sprays. - For thick post nasal drainage, add guaifenesin [phone] mg (Mucinex) twice daily as needed for mucous thinning with adequate hydration to help it work.   4. Chronic urticaria - Continue with Allegra twice daily. - Continue with Pepcid twice daily. - Continue with Xolair monthly.  5. Return in about 3 months (around 09/26/2020).    Please inform of any Emergency Department  visits, hospitalizations, or changes in symptoms. Call 09/28/2020 before going to the ED for breathing or allergy symptoms since we might be able to fit you in for a sick visit. Feel free to contact us anytime with any questions, problems, or concerns.  It was a pleasure to see you again today!  Websites that have reliable patient information: 1. American Academy of Asthma, Allergy, and Immunology: www.aaaai.org 2. Food Allergy Research and Education (FARE): foodallergy.org 3. Mothers of Asthmatics: http://www.asthmacommunitynetwork.org 4. American College of Allergy, Asthma, and Immunology: www.acaai.org   COVID-19 Vaccine Information can be found at: [place] For questions related to vaccine distribution or appointments, please email [email or web address] or call [phone].     Like [email or web address] on [phone] and Instagram for our latest updates!       Make sure you are registered to vote! If you have moved or changed any of your contact information, you will need to get this updated before voting!  In some cases, you MAY be able to register to vote online: [place]

## 2020-06-28 NOTE — Progress Notes (Signed)
FOLLOW UP  Date of Service/Encounter:  06/28/20   Assessment:   Seasonal and perennial allergic rhinitis(trees, weeds, molds, dust mites, dog)  Chronic urticaria - improved on Xolair   Moderate persistent asthma, uncomplicated  Acute sinusitis - with 6+ weeks symptoms  Plan/Recommendations:   1. Moderate persistent asthma, uncomplicated - Lung function not done today, but you did have some wheezing on the left lung fields.  - We are going to change you to Trelegy one puff once daily (replaces Symbicort and Spiriva). - Daily controller medication(s): Singulair 10mg  daily and Trelegy 100/62.5/25 one puff once daily - Prior to physical activity: albuterol 2 puffs 10-15 minutes before physical activity. - Rescue medications: albuterol 4 puffs every 4-6 hours as needed - Asthma control goals:  * Full participation in all desired activities (may need albuterol before activity) * Albuterol use two time or less a week on average (not counting use with activity) * Cough interfering with sleep two time or less a month * Oral steroids no more than once a year * No hospitalizations  2. Seasonal and perennial allergic rhinitis - Continue with nasal saline rinses.  - Continue with the antihistamines for your hives. - Continue with Flonase one spray per nostril twice daily. 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 - Get a HEPA filter for your work area (even one that can just go on your desk to purify the air around you).  3. Acute sinusitis - With your current symptoms and time course, antibiotics are needed: doxycycline 100mg  twice daily for 14 days  - Start the prednisone dose pack provided today.  - Add on nasal saline spray (i.e., Simply Saline) or nasal saline lavage (i.e., NeilMed) as needed prior to medicated nasal sprays. - For thick post nasal drainage, add guaifenesin [phone] mg (Mucinex) twice  daily as needed for mucous thinning with adequate hydration to help it work.   4. Chronic urticaria - Continue with Allegra twice daily. - Continue with Pepcid twice daily. - Continue with Xolair monthly.  5. Return in about 3 months (around 09/26/2020).   Subjective:   Claudia Winters is a 29 y.o. female presenting today for follow up of  Chief Complaint  Patient presents with  . Urticaria  . Nasal Congestion  . Sinusitis    Claudia Winters has a history of the following: Patient Active Problem List   Diagnosis Date Noted  . Bipolar I disorder with depression, severe (HCC) 03/22/2019    History obtained from: chart review and patient.  Claudia Winters is a 29 y.o. female presenting for a follow up visit. She was last seen via televisit in September 2021. At that time, she was having some problems with her wheezing and chest tightness. She was given a course of prednisone and started on Spiriva to complement her Symbicort two puffs BID. For her rhinitis, she was continued on antihistamines as above. Her urticaria was controlled with Xolair every 28 days as well as Allegra and Pepcid.   Since the last visit, she actually has mostly done well aside from her  She does report sinus headache and congestion that has been ongoing for a period of 6 weeks. She is worried about a place where she is working since they are going to be starting some 37. She denies any history of migraines, but she is on several psychiatric medications for PTSD.   Asthma/Respiratory Symptom History: She has been using  her rescue inhaler more. Xolair has been helping. She remains on the Symbicort two puffs BID as well as albuterol as needed. [name]'s asthma has been well controlled. She has not required rescue medication, experienced nocturnal awakenings due to lower respiratory symptoms, nor have activities of daily living been limited. She has required no Emergency Department or Urgent Care visits for her asthma. She has  required zero courses of systemic steroids for asthma exacerbations since the last visit. ACT score today is 20, indicating excellent asthma symptom control.   Allergic Rhinitis Symptom History: She remains on the antihistamines. She has not needed any antibiotics at all since the last visit, despite the sinusitis symptoms above.   Urticaria Symptom History: She has been on the monthly Xolair dosing with stability in her urticarial outbreaks. She has not needed any prednisone at all since the last visit.   Otherwise, there have been no changes to her past medical history, surgical history, family history, or social history.    Review of Systems  Constitutional: Negative.  Negative for chills, fever, malaise/fatigue and weight loss.  HENT: Positive for congestion and sinus pain. Negative for ear discharge and ear pain.   Eyes: Positive for photophobia. Negative for pain, discharge and redness.  Respiratory: Negative for cough, sputum production, shortness of breath and wheezing.   Cardiovascular: Negative.  Negative for chest pain and palpitations.  Gastrointestinal: Negative for abdominal pain, constipation, diarrhea, heartburn, nausea and vomiting.  Skin: Positive for rash. Negative for itching.  Neurological: Negative for dizziness and headaches.  Endo/Heme/Allergies: Positive for environmental allergies. Does not bruise/bleed easily.       Objective:   Blood pressure 120/78, pulse 78, temperature (!) 97.4 F (36.3 C), height 5\' 5"  (1.651 m), weight 224 lb 3.2 oz (101.7 kg), SpO2 99 %. Body mass index is 37.31 kg/m.   Physical Exam:  Physical Exam Constitutional:      Appearance: She is well-developed.     Comments: Sick appearing.  HENT:     Head: Normocephalic and atraumatic.     Right Ear: Tympanic membrane, ear canal and external ear normal.     Left Ear: Tympanic membrane, ear canal and external ear normal.     Nose: No nasal deformity, septal deviation, mucosal  edema, rhinorrhea or epistaxis.     Right Turbinates: Enlarged and swollen.     Left Turbinates: Enlarged and swollen.     Right Sinus: Maxillary sinus tenderness and frontal sinus tenderness present.     Left Sinus: Maxillary sinus tenderness and frontal sinus tenderness present.     Comments: Purulent discharge noted bilaterally.     Mouth/Throat:     Mouth: Oropharynx is clear and moist. Mucous membranes are not pale and not dry.     Pharynx: Uvula midline.  Eyes:     General:        Right eye: No discharge.        Left eye: No discharge.     Extraocular Movements: EOM normal.     Conjunctiva/sclera: Conjunctivae normal.     Right eye: Right conjunctiva is not injected. No chemosis.    Left eye: Left conjunctiva is not injected. No chemosis.    Pupils: Pupils are equal, round, and reactive to light.  Cardiovascular:     Rate and Rhythm: Normal rate and regular rhythm.     Heart sounds: Normal heart sounds.  Pulmonary:     Effort: Pulmonary effort is normal. No tachypnea, accessory muscle usage or respiratory distress.  Breath sounds: Normal breath sounds. No wheezing, rhonchi or rales.     Comments: Moving air well in all lung fields. No increased work of breathing noted.  Chest:     Chest wall: No tenderness.  Lymphadenopathy:     Cervical: No cervical adenopathy.  Skin:    General: Skin is warm.     Capillary Refill: Capillary refill takes less than 2 seconds.     Coloration: Skin is not pale.     Findings: No abrasion, erythema, petechiae or rash. Rash is not papular, urticarial or vesicular.  Psychiatric:        Mood and Affect: Mood and affect normal.      Diagnostic studies: none      Malachi Bonds, MD  [facility]

## 2020-06-30 ENCOUNTER — Other Ambulatory Visit: Payer: Self-pay | Admitting: *Deleted

## 2020-06-30 MED ORDER — DOXYCYCLINE HYCLATE 100 MG PO CAPS: 100.0000 mg | ORAL_CAPSULE | Freq: Two times a day (BID) | ORAL | 0 refills | Status: AC

## 2020-07-06 ENCOUNTER — Ambulatory Visit: Payer: Self-pay

## 2020-07-26 ENCOUNTER — Other Ambulatory Visit: Payer: Self-pay | Admitting: Allergy

## 2020-09-15 ENCOUNTER — Ambulatory Visit: Payer: BC Managed Care – PPO | Admitting: Allergy

## 2020-09-15 DIAGNOSIS — J309 Allergic rhinitis, unspecified: Secondary | ICD-10-CM

## 2020-10-28 ENCOUNTER — Telehealth: Payer: Self-pay | Admitting: Allergy & Immunology

## 2020-10-28 ENCOUNTER — Other Ambulatory Visit: Payer: Self-pay | Admitting: *Deleted

## 2020-10-28 MED ORDER — FAMOTIDINE 20 MG PO TABS: 20.0000 mg | ORAL_TABLET | Freq: Two times a day (BID) | ORAL | 5 refills | Status: DC

## 2020-10-28 MED ORDER — TRELEGY ELLIPTA 100-62.5-25 MCG/INH IN AEPB: 1.0000 | INHALATION_SPRAY | Freq: Every day | RESPIRATORY_TRACT | 5 refills | Status: DC

## 2020-10-28 MED ORDER — FEXOFENADINE HCL 180 MG PO TABS: 180.0000 mg | ORAL_TABLET | Freq: Every day | ORAL | 2 refills | Status: DC

## 2020-10-28 NOTE — Telephone Encounter (Signed)
Pt states she called a while ago about a prescription that was not sent in. Pt was calling to see if it could be sent in today, prescription is for Trelegy per Dr. Ernst Bowler.   Pt states she had a telehealth visit with a different provider, one that she had not met, & that provider did not agree with previous allergy medication. Pt states she had been on said regiment for a long time & now that allergy season is in place she feels as if she needs previous allergy regiment. Pt stated she was on allegra per Dr. Nelva Bush, pt would like a refill for ALLEGRA and FAMOTIDINE.   Pt would like all prescriptions sent to [facility] on [address].   Best contact number is, [phone]  Please advise.

## 2020-10-28 NOTE — Telephone Encounter (Signed)
Patient called back and was informed. Patient verbalized understanding.  °

## 2020-10-28 NOTE — Telephone Encounter (Signed)
Refills have been sent in to requested pharmacy. Called and left a voicemail asking for patient to return call to inform.  ?

## 2020-11-02 DIAGNOSIS — F439 Reaction to severe stress, unspecified: Secondary | ICD-10-CM | POA: Diagnosis not present

## 2020-11-02 DIAGNOSIS — F411 Generalized anxiety disorder: Secondary | ICD-10-CM | POA: Diagnosis not present

## 2020-11-02 DIAGNOSIS — F1020 Alcohol dependence, uncomplicated: Secondary | ICD-10-CM | POA: Diagnosis not present

## 2020-11-02 DIAGNOSIS — F1210 Cannabis abuse, uncomplicated: Secondary | ICD-10-CM | POA: Diagnosis not present

## 2020-11-03 DIAGNOSIS — F1020 Alcohol dependence, uncomplicated: Secondary | ICD-10-CM | POA: Diagnosis not present

## 2020-11-04 DIAGNOSIS — F1210 Cannabis abuse, uncomplicated: Secondary | ICD-10-CM | POA: Diagnosis not present

## 2020-11-04 DIAGNOSIS — F1020 Alcohol dependence, uncomplicated: Secondary | ICD-10-CM | POA: Diagnosis not present

## 2020-11-05 DIAGNOSIS — F1020 Alcohol dependence, uncomplicated: Secondary | ICD-10-CM | POA: Diagnosis not present

## 2020-11-06 DIAGNOSIS — F1020 Alcohol dependence, uncomplicated: Secondary | ICD-10-CM | POA: Diagnosis not present

## 2020-11-07 DIAGNOSIS — F1210 Cannabis abuse, uncomplicated: Secondary | ICD-10-CM | POA: Diagnosis not present

## 2020-11-07 DIAGNOSIS — F439 Reaction to severe stress, unspecified: Secondary | ICD-10-CM | POA: Diagnosis not present

## 2020-11-07 DIAGNOSIS — F411 Generalized anxiety disorder: Secondary | ICD-10-CM | POA: Diagnosis not present

## 2020-11-07 DIAGNOSIS — F1020 Alcohol dependence, uncomplicated: Secondary | ICD-10-CM | POA: Diagnosis not present

## 2020-11-08 DIAGNOSIS — F1020 Alcohol dependence, uncomplicated: Secondary | ICD-10-CM | POA: Diagnosis not present

## 2020-11-09 DIAGNOSIS — F1020 Alcohol dependence, uncomplicated: Secondary | ICD-10-CM | POA: Diagnosis not present

## 2020-11-10 DIAGNOSIS — F1020 Alcohol dependence, uncomplicated: Secondary | ICD-10-CM | POA: Diagnosis not present

## 2020-11-11 DIAGNOSIS — F1020 Alcohol dependence, uncomplicated: Secondary | ICD-10-CM | POA: Diagnosis not present

## 2020-11-12 DIAGNOSIS — F1020 Alcohol dependence, uncomplicated: Secondary | ICD-10-CM | POA: Diagnosis not present

## 2020-11-13 DIAGNOSIS — F1020 Alcohol dependence, uncomplicated: Secondary | ICD-10-CM | POA: Diagnosis not present

## 2020-11-14 DIAGNOSIS — F1020 Alcohol dependence, uncomplicated: Secondary | ICD-10-CM | POA: Diagnosis not present

## 2020-11-15 DIAGNOSIS — F1020 Alcohol dependence, uncomplicated: Secondary | ICD-10-CM | POA: Diagnosis not present

## 2020-11-16 DIAGNOSIS — F1020 Alcohol dependence, uncomplicated: Secondary | ICD-10-CM | POA: Diagnosis not present

## 2020-11-16 DIAGNOSIS — F439 Reaction to severe stress, unspecified: Secondary | ICD-10-CM | POA: Diagnosis not present

## 2020-11-16 DIAGNOSIS — F1210 Cannabis abuse, uncomplicated: Secondary | ICD-10-CM | POA: Diagnosis not present

## 2020-11-16 DIAGNOSIS — F411 Generalized anxiety disorder: Secondary | ICD-10-CM | POA: Diagnosis not present

## 2020-11-17 DIAGNOSIS — F1020 Alcohol dependence, uncomplicated: Secondary | ICD-10-CM | POA: Diagnosis not present

## 2020-11-18 DIAGNOSIS — F1020 Alcohol dependence, uncomplicated: Secondary | ICD-10-CM | POA: Diagnosis not present

## 2020-11-19 DIAGNOSIS — F1020 Alcohol dependence, uncomplicated: Secondary | ICD-10-CM | POA: Diagnosis not present

## 2020-11-20 DIAGNOSIS — F1020 Alcohol dependence, uncomplicated: Secondary | ICD-10-CM | POA: Diagnosis not present

## 2020-11-21 DIAGNOSIS — F1020 Alcohol dependence, uncomplicated: Secondary | ICD-10-CM | POA: Diagnosis not present

## 2020-11-22 DIAGNOSIS — F1020 Alcohol dependence, uncomplicated: Secondary | ICD-10-CM | POA: Diagnosis not present

## 2020-11-23 DIAGNOSIS — F1210 Cannabis abuse, uncomplicated: Secondary | ICD-10-CM | POA: Diagnosis not present

## 2020-11-23 DIAGNOSIS — F439 Reaction to severe stress, unspecified: Secondary | ICD-10-CM | POA: Diagnosis not present

## 2020-11-23 DIAGNOSIS — F1020 Alcohol dependence, uncomplicated: Secondary | ICD-10-CM | POA: Diagnosis not present

## 2020-11-23 DIAGNOSIS — F411 Generalized anxiety disorder: Secondary | ICD-10-CM | POA: Diagnosis not present

## 2020-11-24 DIAGNOSIS — F1020 Alcohol dependence, uncomplicated: Secondary | ICD-10-CM | POA: Diagnosis not present

## 2020-11-25 DIAGNOSIS — F1020 Alcohol dependence, uncomplicated: Secondary | ICD-10-CM | POA: Diagnosis not present

## 2020-11-26 DIAGNOSIS — F1020 Alcohol dependence, uncomplicated: Secondary | ICD-10-CM | POA: Diagnosis not present

## 2020-11-27 DIAGNOSIS — F1020 Alcohol dependence, uncomplicated: Secondary | ICD-10-CM | POA: Diagnosis not present

## 2020-11-28 DIAGNOSIS — F411 Generalized anxiety disorder: Secondary | ICD-10-CM | POA: Diagnosis not present

## 2020-11-28 DIAGNOSIS — F439 Reaction to severe stress, unspecified: Secondary | ICD-10-CM | POA: Diagnosis not present

## 2020-11-28 DIAGNOSIS — F1210 Cannabis abuse, uncomplicated: Secondary | ICD-10-CM | POA: Diagnosis not present

## 2020-11-28 DIAGNOSIS — F1020 Alcohol dependence, uncomplicated: Secondary | ICD-10-CM | POA: Diagnosis not present

## 2020-11-29 DIAGNOSIS — F1020 Alcohol dependence, uncomplicated: Secondary | ICD-10-CM | POA: Diagnosis not present

## 2020-11-30 DIAGNOSIS — F1020 Alcohol dependence, uncomplicated: Secondary | ICD-10-CM | POA: Diagnosis not present

## 2020-12-01 DIAGNOSIS — F1020 Alcohol dependence, uncomplicated: Secondary | ICD-10-CM | POA: Diagnosis not present

## 2020-12-02 DIAGNOSIS — F1020 Alcohol dependence, uncomplicated: Secondary | ICD-10-CM | POA: Diagnosis not present

## 2020-12-03 DIAGNOSIS — F1020 Alcohol dependence, uncomplicated: Secondary | ICD-10-CM | POA: Diagnosis not present

## 2020-12-04 DIAGNOSIS — F1020 Alcohol dependence, uncomplicated: Secondary | ICD-10-CM | POA: Diagnosis not present

## 2020-12-05 DIAGNOSIS — F1020 Alcohol dependence, uncomplicated: Secondary | ICD-10-CM | POA: Diagnosis not present

## 2020-12-06 DIAGNOSIS — F1020 Alcohol dependence, uncomplicated: Secondary | ICD-10-CM | POA: Diagnosis not present

## 2020-12-07 DIAGNOSIS — F1210 Cannabis abuse, uncomplicated: Secondary | ICD-10-CM | POA: Diagnosis not present

## 2020-12-07 DIAGNOSIS — F1020 Alcohol dependence, uncomplicated: Secondary | ICD-10-CM | POA: Diagnosis not present

## 2020-12-07 DIAGNOSIS — F439 Reaction to severe stress, unspecified: Secondary | ICD-10-CM | POA: Diagnosis not present

## 2020-12-07 DIAGNOSIS — F411 Generalized anxiety disorder: Secondary | ICD-10-CM | POA: Diagnosis not present

## 2020-12-08 DIAGNOSIS — F3132 Bipolar disorder, current episode depressed, moderate: Secondary | ICD-10-CM | POA: Diagnosis not present

## 2020-12-09 DIAGNOSIS — F3132 Bipolar disorder, current episode depressed, moderate: Secondary | ICD-10-CM | POA: Diagnosis not present

## 2020-12-10 DIAGNOSIS — F3132 Bipolar disorder, current episode depressed, moderate: Secondary | ICD-10-CM | POA: Diagnosis not present

## 2020-12-11 DIAGNOSIS — F3132 Bipolar disorder, current episode depressed, moderate: Secondary | ICD-10-CM | POA: Diagnosis not present

## 2020-12-12 DIAGNOSIS — F3132 Bipolar disorder, current episode depressed, moderate: Secondary | ICD-10-CM | POA: Diagnosis not present

## 2020-12-13 DIAGNOSIS — F3132 Bipolar disorder, current episode depressed, moderate: Secondary | ICD-10-CM | POA: Diagnosis not present

## 2020-12-14 DIAGNOSIS — F3132 Bipolar disorder, current episode depressed, moderate: Secondary | ICD-10-CM | POA: Diagnosis not present

## 2020-12-15 DIAGNOSIS — F3132 Bipolar disorder, current episode depressed, moderate: Secondary | ICD-10-CM | POA: Diagnosis not present

## 2020-12-16 DIAGNOSIS — F3132 Bipolar disorder, current episode depressed, moderate: Secondary | ICD-10-CM | POA: Diagnosis not present

## 2020-12-17 DIAGNOSIS — F3132 Bipolar disorder, current episode depressed, moderate: Secondary | ICD-10-CM | POA: Diagnosis not present

## 2020-12-18 DIAGNOSIS — F3132 Bipolar disorder, current episode depressed, moderate: Secondary | ICD-10-CM | POA: Diagnosis not present

## 2020-12-19 DIAGNOSIS — F3132 Bipolar disorder, current episode depressed, moderate: Secondary | ICD-10-CM | POA: Diagnosis not present

## 2020-12-20 DIAGNOSIS — F3132 Bipolar disorder, current episode depressed, moderate: Secondary | ICD-10-CM | POA: Diagnosis not present

## 2020-12-21 DIAGNOSIS — F3132 Bipolar disorder, current episode depressed, moderate: Secondary | ICD-10-CM | POA: Diagnosis not present

## 2020-12-22 DIAGNOSIS — F3132 Bipolar disorder, current episode depressed, moderate: Secondary | ICD-10-CM | POA: Diagnosis not present

## 2020-12-23 DIAGNOSIS — F3132 Bipolar disorder, current episode depressed, moderate: Secondary | ICD-10-CM | POA: Diagnosis not present

## 2020-12-24 DIAGNOSIS — F3132 Bipolar disorder, current episode depressed, moderate: Secondary | ICD-10-CM | POA: Diagnosis not present

## 2020-12-25 DIAGNOSIS — F3132 Bipolar disorder, current episode depressed, moderate: Secondary | ICD-10-CM | POA: Diagnosis not present

## 2020-12-26 DIAGNOSIS — F3132 Bipolar disorder, current episode depressed, moderate: Secondary | ICD-10-CM | POA: Diagnosis not present

## 2020-12-27 DIAGNOSIS — F3132 Bipolar disorder, current episode depressed, moderate: Secondary | ICD-10-CM | POA: Diagnosis not present

## 2020-12-28 DIAGNOSIS — F3132 Bipolar disorder, current episode depressed, moderate: Secondary | ICD-10-CM | POA: Diagnosis not present

## 2020-12-29 DIAGNOSIS — F3132 Bipolar disorder, current episode depressed, moderate: Secondary | ICD-10-CM | POA: Diagnosis not present

## 2020-12-30 DIAGNOSIS — F3132 Bipolar disorder, current episode depressed, moderate: Secondary | ICD-10-CM | POA: Diagnosis not present

## 2020-12-30 DIAGNOSIS — E139 Other specified diabetes mellitus without complications: Secondary | ICD-10-CM

## 2020-12-30 HISTORY — DX: Other specified diabetes mellitus without complications: E13.9

## 2020-12-31 DIAGNOSIS — F3132 Bipolar disorder, current episode depressed, moderate: Secondary | ICD-10-CM | POA: Diagnosis not present

## 2021-01-01 DIAGNOSIS — F3132 Bipolar disorder, current episode depressed, moderate: Secondary | ICD-10-CM | POA: Diagnosis not present

## 2021-01-02 DIAGNOSIS — F3132 Bipolar disorder, current episode depressed, moderate: Secondary | ICD-10-CM | POA: Diagnosis not present

## 2021-01-03 DIAGNOSIS — F3132 Bipolar disorder, current episode depressed, moderate: Secondary | ICD-10-CM | POA: Diagnosis not present

## 2021-01-04 DIAGNOSIS — F3132 Bipolar disorder, current episode depressed, moderate: Secondary | ICD-10-CM | POA: Diagnosis not present

## 2021-01-05 DIAGNOSIS — F3132 Bipolar disorder, current episode depressed, moderate: Secondary | ICD-10-CM | POA: Diagnosis not present

## 2021-01-06 DIAGNOSIS — F3132 Bipolar disorder, current episode depressed, moderate: Secondary | ICD-10-CM | POA: Diagnosis not present

## 2021-01-07 DIAGNOSIS — F3132 Bipolar disorder, current episode depressed, moderate: Secondary | ICD-10-CM | POA: Diagnosis not present

## 2021-01-08 DIAGNOSIS — F3132 Bipolar disorder, current episode depressed, moderate: Secondary | ICD-10-CM | POA: Diagnosis not present

## 2021-01-09 DIAGNOSIS — F3132 Bipolar disorder, current episode depressed, moderate: Secondary | ICD-10-CM | POA: Diagnosis not present

## 2021-01-10 DIAGNOSIS — F3132 Bipolar disorder, current episode depressed, moderate: Secondary | ICD-10-CM | POA: Diagnosis not present

## 2021-01-11 DIAGNOSIS — F3132 Bipolar disorder, current episode depressed, moderate: Secondary | ICD-10-CM | POA: Diagnosis not present

## 2021-01-12 DIAGNOSIS — F3132 Bipolar disorder, current episode depressed, moderate: Secondary | ICD-10-CM | POA: Diagnosis not present

## 2021-01-13 DIAGNOSIS — F3132 Bipolar disorder, current episode depressed, moderate: Secondary | ICD-10-CM | POA: Diagnosis not present

## 2021-01-16 ENCOUNTER — Telehealth: Payer: Self-pay | Admitting: *Deleted

## 2021-01-16 DIAGNOSIS — F3132 Bipolar disorder, current episode depressed, moderate: Secondary | ICD-10-CM | POA: Diagnosis not present

## 2021-01-16 DIAGNOSIS — F3111 Bipolar disorder, current episode manic without psychotic features, mild: Secondary | ICD-10-CM | POA: Diagnosis not present

## 2021-01-16 DIAGNOSIS — F900 Attention-deficit hyperactivity disorder, predominantly inattentive type: Secondary | ICD-10-CM | POA: Diagnosis not present

## 2021-01-16 DIAGNOSIS — F410 Panic disorder [episodic paroxysmal anxiety] without agoraphobia: Secondary | ICD-10-CM | POA: Diagnosis not present

## 2021-01-16 NOTE — Telephone Encounter (Signed)
L/m for patient she had called last week while I was on vacation and awants to restart her Xolair. Advised patient that if same Ins-everified [employer] [place] she can just call to make appt to restart in clinic

## 2021-01-19 DIAGNOSIS — E119 Type 2 diabetes mellitus without complications: Secondary | ICD-10-CM | POA: Diagnosis not present

## 2021-01-24 ENCOUNTER — Ambulatory Visit: Payer: BC Managed Care – PPO | Admitting: Family Medicine

## 2021-01-24 ENCOUNTER — Ambulatory Visit: Payer: Self-pay

## 2021-01-24 DIAGNOSIS — J309 Allergic rhinitis, unspecified: Secondary | ICD-10-CM

## 2021-01-24 NOTE — Progress Notes (Deleted)
   [address] Claudia Winters [place] [address] Dept: [phone]  FOLLOW UP NOTE  Patient ID: Claudia Winters, female    DOB: [birth year]  Age: 30 y.o. MRN: [id number] Date of Office Visit: 01/24/2021  Assessment  Chief Complaint: No chief complaint on file.  HPI Claudia Winters    Drug Allergies:  No Known Allergies  Physical Exam: There were no vitals taken for this visit.   Physical Exam  Diagnostics:    Assessment and Plan: No diagnosis found.  No orders of the defined types were placed in this encounter.   There are no Patient Instructions on file for this visit.  No follow-ups on file.    Thank you for the opportunity to care for this patient.  Please do not hesitate to contact me with questions.  Claudia Leyland, FNP [facility]

## 2021-01-26 ENCOUNTER — Encounter: Payer: Self-pay | Admitting: Family Medicine

## 2021-01-26 ENCOUNTER — Ambulatory Visit (INDEPENDENT_AMBULATORY_CARE_PROVIDER_SITE_OTHER): Payer: BC Managed Care – PPO | Admitting: Family Medicine

## 2021-01-26 ENCOUNTER — Other Ambulatory Visit: Payer: Self-pay

## 2021-01-26 DIAGNOSIS — L501 Idiopathic urticaria: Secondary | ICD-10-CM

## 2021-01-26 DIAGNOSIS — J3089 Other allergic rhinitis: Secondary | ICD-10-CM | POA: Diagnosis not present

## 2021-01-26 DIAGNOSIS — J302 Other seasonal allergic rhinitis: Secondary | ICD-10-CM

## 2021-01-26 MED ORDER — ALBUTEROL SULFATE (2.5 MG/3ML) 0.083% IN NEBU: 2.5000 mg | INHALATION_SOLUTION | RESPIRATORY_TRACT | 1 refills | Status: DC | PRN

## 2021-01-26 MED ORDER — ALBUTEROL SULFATE HFA 108 (90 BASE) MCG/ACT IN AERS: 1.0000 | INHALATION_SPRAY | Freq: Four times a day (QID) | RESPIRATORY_TRACT | 1 refills | Status: DC | PRN

## 2021-01-26 MED ORDER — EPINEPHRINE 0.3 MG/0.3ML IJ SOAJ: INTRAMUSCULAR | 1 refills | Status: DC

## 2021-01-26 MED ORDER — TRELEGY ELLIPTA 100-62.5-25 MCG/INH IN AEPB: 1.0000 | INHALATION_SPRAY | Freq: Every day | RESPIRATORY_TRACT | 5 refills | Status: DC

## 2021-01-26 MED ORDER — MONTELUKAST SODIUM 10 MG PO TABS: 10.0000 mg | ORAL_TABLET | Freq: Every day | ORAL | 5 refills | Status: DC

## 2021-01-26 NOTE — Progress Notes (Signed)
RE: Claudia Winters MRN: [id number] DOB: [birth year] Date of Telemedicine Visit: 01/26/2021  Referring provider: Ileana LaddWong, Francis P, MD Primary care provider: Ileana LaddWong, Francis P, MD  Chief Complaint: Asthma (Doing ok)   Telemedicine Follow Up Visit via Telephone: I connected with Claudia Winters for a follow up on 01/26/21 by telephone and verified that I am speaking with the correct person using two identifiers.   I discussed the limitations, risks, security and privacy concerns of performing an evaluation and management service by telephone and the availability of in person appointments. I also discussed with the patient that there may be a patient responsible charge related to this service. The patient expressed understanding and agreed to proceed.  Patient is at home Provider is at the office.  Visit start time: 927 Visit end time: [id number] [facility] Medical consent and medical assistant/nurse: Damita  History of Present Illness: She is a 30 y.o. female, who is being followed for asthma, allergic rhinitis, and chronic urticaria. Her previous allergy office visit was on 06/28/2020 with Dr. Dellis AnesGallagher.  In the interim, she has been in [place] for the last 3 months where her allergies have been mostly well controlled.  At today's visit, she reports her asthma has been moderately well controlled with occasional shortness of breath when hiking.  Otherwise, she denies shortness of breath, cough, or wheeze with activity or rest.  She continues Trelegy Ellipta 200-1 puff once a day, montelukast 10 mg once a day, and uses albuterol before activity and uses albuterol infrequently for rescue.  Allergic rhinitis is reported as moderately well controlled with symptoms including nasal congestion, sneeze, and occasional postnasal drainage.  She does report a feeling of pressure above her eyes and headache in the back of her head over the past couple of weeks.  She reports that she takes  prednisone about 3-4 times a year for allergy flare and hives.  Her last allergy testing was on 08/27/2018 via lab work and was positive to dust mite, dog, molds, tree pollen, and weed pollen.  She continues Allegra 180 mg once a day and Flonase daily with poor application technique.  She is not currently using azelastine or nasal saline rinses.  Allergic conjunctivitis is reported as poorly controlled with symptoms including red and itchy eyes for which she is not currently using an allergy eyedrop.  Urticaria is reported as poorly controlled with hives occurring daily which resolve and occur in a different area.  She denies concomitant cardiopulmonary or gastrointestinal symptoms with these hives.  She reports that triggers include going outside and cut grass.  She reports that she currently has 7 hives.  She is interested in restarting Xolair injections which had controlled her hives in the past.  She reports several incidences of epistaxis occurring while she was in [place] with bleeding lasting 3 to 4 minutes with no medical intervention.  She denies epistaxis since she has been back in [place].  Her current medications are listed in the chart.   Assessment and Plan: Claudia Winters is a [age] y.o. female with: Patient Instructions  Asthma Decrease montelukast to 10 mg once a day to prevent cough or wheeze Continue Trelegy 1 puff once a day to prevent cough or wheeze Continue albuterol 2 puffs every 4 hours as needed for cough or wheeze OR Instead use albuterol 0.083% solution via nebulizer one unit vial every 4 hours as needed for cough or wheeze You may use albuterol 2 puffs 5 to 15 minutes  before activity to decrease cough or wheeze  Allergic rhinitis Continue allergen avoidance measures directed toward tree pollen, weed pollen, mold, dust mite, and dog as listed below Continue Allegra 180 mg once a day as needed for runny nose or itch Continue Flonase 2 sprays in each nostril once a day as needed  for stuffy nose  Begin azelastine 1 spray in each nostril twice a day as needed for nasal symptoms Consider saline nasal rinses as needed for nasal symptoms. Use this before any medicated nasal sprays for best result Begin nasal saline gel as needed for dry nostrils Consider allergen immunotherapy. Written information sent to address listed in the chart. We will need to update your allergy testing to make sure all of your allergens are included in your allergen immunotherapy. We will collect blood work at your visit in the morning before you get your Xolair injection.   Allergic conjunctivitis Some over the counter eye drops include Pataday one drop in each eye once a day as needed for red, itchy eyes OR Zaditor one drop in each eye twice a day as needed for red itchy eyes.  Chronic urticaria Take the least amount of medications while remaining hive free Allegra 180 mg twice a day and famotidine (Pepcid) 20 mg twice a day. If no symptoms for 7-14 days then decrease to. Allegra 180 mg twice a day and famotidine (Pepcid) 20 mg once a day.  If no symptoms for 7-14 days then decrease to. Allegra 180 mg twice a day.  If no symptoms for 7-14 days then decrease to. Allegra 180 mg once a day.  Restart Xolair once a month and have access to an epinephrine auto-injector set After your visit to the for your blood draw tomorrow, begin prednisone 10 mg tablets. Take 2 tablets once a day for 4 days, then take 1 tablet on the 5th day, then stop.    Epistaxis Pinch both nostrils while leaning forward for at least 5 minutes before checking to see if the bleeding has stopped. If bleeding is not controlled within 5-10 minutes apply a cotton ball soaked with oxymetazoline (Afrin) to the bleeding nostril for a few seconds.  If the problem persists or worsens a referral to ENT for further evaluation may be necessary.  Call the clinic if this treatment plan is not working well for you.  Follow up in the clinic in 2  months or sooner if needed.  Return in about 2 months (around 03/29/2021), or if symptoms worsen or fail to improve.  Meds ordered this encounter  Medications   albuterol (PROVENTIL) (2.5 MG/3ML) 0.083% nebulizer solution    Sig: Take 3 mLs (2.5 mg total) by nebulization every 4 (four) hours as needed for wheezing or shortness of breath.    Dispense:  75 mL    Refill:  1   albuterol (VENTOLIN HFA) 108 (90 Base) MCG/ACT inhaler    Sig: Inhale 1-2 puffs into the lungs every 6 (six) hours as needed for wheezing or shortness of breath.    Dispense:  1 each    Refill:  1   EPINEPHrine 0.3 mg/0.3 mL IJ SOAJ injection    Sig: Use as directed for severe allergic reactions    Dispense:  2 each    Refill:  1    Please dispense Mylan or Teva brand generic only. Thank you.   TRELEGY ELLIPTA 100-62.5-25 MCG/INH AEPB    Sig: Inhale 1 puff into the lungs daily.    Dispense:  28 each    Refill:  5   montelukast (SINGULAIR) 10 MG tablet    Sig: Take 1 tablet (10 mg total) by mouth at bedtime.    Dispense:  30 tablet    Refill:  5    Lab Orders  Allergens, Zone 2    Medication List:  Current Outpatient Medications  Medication Sig Dispense Refill   amphetamine-dextroamphetamine (ADDERALL XR) 20 MG 24 hr capsule Take 2 capsules by mouth in the morning.     buPROPion (WELLBUTRIN XL) 300 MG 24 hr tablet Take 300 mg by mouth every morning.     cariprazine (VRAYLAR) capsule Take 1 capsule (1.5 mg total) by mouth daily. (Patient taking differently: Take 6 mg by mouth daily.) 30 capsule 0   clonazePAM (KLONOPIN) 1 MG tablet Take 1 mg by mouth 3 (three) times daily as needed.     fexofenadine (ALLEGRA) 180 MG tablet Take 1 tablet (180 mg total) by mouth daily. 90 tablet 2   hydrOXYzine (VISTARIL) 50 MG capsule Take 50 mg by mouth every 4 (four) hours as needed.     levonorgestrel (KYLEENA) 19.5 MG IUD Kyleena 17.5 mcg/24 hour (5 years) intrauterine device  Take 1 device by intrauterine route.      nicotine polacrilex (NICORETTE) 2 MG gum SMARTSIG:1 Gum By Mouth     OZEMPIC, 0.25 OR 0.5 MG/DOSE, 2 MG/1.5ML SOPN Inject 0.5 mg into the skin once a week.     QUEtiapine (SEROQUEL) 100 MG tablet Take 200 mg by mouth at bedtime.     albuterol (PROVENTIL) (2.5 MG/3ML) 0.083% nebulizer solution Take 3 mLs (2.5 mg total) by nebulization every 4 (four) hours as needed for wheezing or shortness of breath. 75 mL 1   albuterol (VENTOLIN HFA) 108 (90 Base) MCG/ACT inhaler Inhale 1-2 puffs into the lungs every 6 (six) hours as needed for wheezing or shortness of breath. 1 each 1   busPIRone (BUSPAR) 30 MG tablet Take 30 mg by mouth 2 (two) times daily. (Patient not taking: Reported on 01/26/2021)     EPINEPHrine 0.3 mg/0.3 mL IJ SOAJ injection Use as directed for severe allergic reactions 2 each 1   famotidine (PEPCID) 20 MG tablet Take 1 tablet (20 mg total) by mouth 2 (two) times daily. (Patient not taking: Reported on 01/26/2021) 60 tablet 5   montelukast (SINGULAIR) 10 MG tablet Take 1 tablet (10 mg total) by mouth at bedtime. 30 tablet 5   TRELEGY ELLIPTA 100-62.5-25 MCG/INH AEPB Inhale 1 puff into the lungs daily. 28 each 5   Current Facility-Administered Medications  Medication Dose Route Frequency Provider Last Rate Last Admin   omalizumab Geoffry Paradise) injection 300 mg  300 mg Subcutaneous Q28 days Nehemiah Settle, FNP   300 mg at 05/13/20 1138   Allergies: Allergies  Allergen Reactions   Corn-Containing Products Swelling and Other (See Comments)    Face goes numb   Lavender Oil Other (See Comments)    Throat swelling   I reviewed her past medical history, social history, family history, and environmental history and no significant changes have been reported from previous visit on 06/28/2020.   Objective: Physical Exam Not obtained as encounter was done via telephone.   Previous notes and tests were reviewed.  I discussed the assessment and treatment plan with the patient. The patient was  provided an opportunity to ask questions and all were answered. The patient agreed with the plan and demonstrated an understanding of the instructions.   The patient was advised  to call back or seek an in-person evaluation if the symptoms worsen or if the condition fails to improve as anticipated.  I provided 33 minutes of non-face-to-face time during this encounter.  It was my pleasure to participate in Khristin Burnsworth's care today. Please feel free to contact me with any questions or concerns.   Sincerely,  Thermon Leyland, FNP

## 2021-01-26 NOTE — Patient Instructions (Addendum)
Asthma Decrease montelukast to 10 mg once a day to prevent cough or wheeze Continue Trelegy 1 puff once a day to prevent cough or wheeze Continue albuterol 2 puffs every 4 hours as needed for cough or wheeze OR Instead use albuterol 0.083% solution via nebulizer one unit vial every 4 hours as needed for cough or wheeze You may use albuterol 2 puffs 5 to 15 minutes before activity to decrease cough or wheeze  Allergic rhinitis Continue allergen avoidance measures directed toward tree pollen, weed pollen, mold, dust mite, and dog as listed below Continue Allegra 180 mg once a day as needed for runny nose or itch Continue Flonase 2 sprays in each nostril once a day as needed for stuffy nose  Begin azelastine 1 spray in each nostril twice a day as needed for nasal symptoms Consider saline nasal rinses as needed for nasal symptoms. Use this before any medicated nasal sprays for best result Begin nasal saline gel as needed for dry nostrils Consider allergen immunotherapy. Written information sent to address listed in the chart. We will need to update your allergy testing to make sure all of your allergens are included in your allergen immunotherapy. We will collect blood work at your visit in the morning before you get your Xolair injection.   Allergic conjunctivitis Some over the counter eye drops include Pataday one drop in each eye once a day as needed for red, itchy eyes OR Zaditor one drop in each eye twice a day as needed for red itchy eyes.  Chronic urticaria Take the least amount of medications while remaining hive free Allegra 180 mg twice a day and famotidine (Pepcid) 20 mg twice a day. If no symptoms for 7-14 days then decrease to. Allegra 180 mg twice a day and famotidine (Pepcid) 20 mg once a day.  If no symptoms for 7-14 days then decrease to. Allegra 180 mg twice a day.  If no symptoms for 7-14 days then decrease to. Allegra 180 mg once a day.  Restart Xolair once a month and have  access to an epinephrine auto-injector set After your visit to the for your blood draw tomorrow, begin prednisone 10 mg tablets. Take 2 tablets once a day for 4 days, then take 1 tablet on the 5th day, then stop.    Epistaxis Pinch both nostrils while leaning forward for at least 5 minutes before checking to see if the bleeding has stopped. If bleeding is not controlled within 5-10 minutes apply a cotton ball soaked with oxymetazoline (Afrin) to the bleeding nostril for a few seconds.  If the problem persists or worsens a referral to ENT for further evaluation may be necessary.  Call the clinic if this treatment plan is not working well for you.  Follow up in the clinic in 2 months or sooner if needed.

## 2021-01-27 ENCOUNTER — Ambulatory Visit (INDEPENDENT_AMBULATORY_CARE_PROVIDER_SITE_OTHER): Payer: BC Managed Care – PPO

## 2021-01-27 DIAGNOSIS — J3089 Other allergic rhinitis: Secondary | ICD-10-CM | POA: Diagnosis not present

## 2021-01-27 DIAGNOSIS — L501 Idiopathic urticaria: Secondary | ICD-10-CM | POA: Diagnosis not present

## 2021-01-27 DIAGNOSIS — J302 Other seasonal allergic rhinitis: Secondary | ICD-10-CM | POA: Diagnosis not present

## 2021-01-27 DIAGNOSIS — L508 Other urticaria: Secondary | ICD-10-CM

## 2021-01-27 NOTE — Progress Notes (Signed)
Patient came in today to restart her Xolair injections. Patient got 150 ml in each arm. Patient got her labs drawn before her injections. Patient was instructed to wait 30 minutes in the lobby but did not do so.

## 2021-01-31 ENCOUNTER — Ambulatory Visit: Payer: BC Managed Care – PPO | Admitting: Allergy & Immunology

## 2021-01-31 LAB — ALLERGENS, ZONE 2
Alternaria Alternata IgE: 13.9 kU/L — AB
Amer Sycamore IgE Qn: <0.1 kU/L
Aspergillus Fumigatus IgE: 0.86 kU/L — AB
Bahia Grass IgE: <0.1 kU/L
Bermuda Grass IgE: <0.1 kU/L
Cat Dander IgE: <0.1 kU/L
Cedar, Mountain IgE: 0.3 kU/L — AB
Cladosporium Herbarum IgE: 1.48 kU/L — AB
Cockroach, American IgE: <0.1 kU/L
Common Silver Birch IgE: <0.1 kU/L
D Farinae IgE: 0.17 kU/L — AB
D Pteronyssinus IgE: 0.18 kU/L — AB
Dog Dander IgE: 0.14 kU/L — AB
Elm, American IgE: <0.1 kU/L
Hickory, White IgE: 0.21 kU/L — AB
Johnson Grass IgE: <0.1 kU/L
Maple/Box Elder IgE: <0.1 kU/L
Mucor Racemosus IgE: <0.1 kU/L
Mugwort IgE Qn: <0.1 kU/L
Nettle IgE: 0.29 kU/L — AB
Oak, White IgE: <0.1 kU/L
Penicillium Chrysogen IgE: 0.8 kU/L — AB
Pigweed, Rough IgE: 0.18 kU/L — AB
Plantain, English IgE: <0.1 kU/L
Ragweed, Short IgE: 0.17 kU/L — AB
Sheep Sorrel IgE Qn: <0.1 kU/L
Stemphylium Herbarum IgE: 4.11 kU/L — AB
Sweet gum IgE RAST Ql: <0.1 kU/L
Timothy Grass IgE: <0.1 kU/L
White Mulberry IgE: <0.1 kU/L

## 2021-01-31 NOTE — Progress Notes (Signed)
Can you please let this patient know the lab tests indicate allergy to dust mites, dog, molds, tree pollen, ragweed pollen, and weed pollen. Can you please send out avoidance measures and allergen immunotherapy information to this patient. Thank you

## 2021-02-06 DIAGNOSIS — Z23 Encounter for immunization: Secondary | ICD-10-CM | POA: Diagnosis not present

## 2021-02-10 DIAGNOSIS — E119 Type 2 diabetes mellitus without complications: Secondary | ICD-10-CM | POA: Diagnosis not present

## 2021-02-10 NOTE — Addendum Note (Signed)
Addended by: Alfonse Spruce on: 02/10/2021 04:48 PM   Modules accepted: Orders

## 2021-02-14 DIAGNOSIS — J3089 Other allergic rhinitis: Secondary | ICD-10-CM | POA: Diagnosis not present

## 2021-02-14 NOTE — Progress Notes (Signed)
VIALS MADE. EXP 02-14-22 

## 2021-02-14 NOTE — Progress Notes (Signed)
Aeroallergen Immunotherapy   Ordering Provider: Dr. Malachi Bonds   Patient Details  Name: Marine Lezotte  MRN: [id number]  Date of Birth: 05-30-1991   Order 1 of 2   Vial Label: T/W/DM/D   0.5 ml (Volume)  1:20 Concentration -- Weed Mix*  0.5 ml (Volume)  1:20 Concentration -- Eastern 10 Tree Mix (also Sweet Gum)  0.2 ml (Volume)  1:10 Concentration -- Cedar, red  0.1 ml (Volume)  1:10 Concentration -- Hickory*  0.5 ml (Volume)  1:10 Concentration -- Dog Epithelia  0.4 ml (Volume)   AU Concentration -- Mite Mix (DF 5,000 & DP 5,000)    2.2  ml Extract Subtotal  2.8  ml Diluent  5.0  ml Maintenance Total   Schedule:  B   Blue Vial (1:100,000): Schedule B (6 doses)  Yellow Vial (1:10,000): Schedule B (6 doses)  Green Vial (1:1,000): Schedule B (6 doses)  Red Vial (1:100): Schedule B (6 doses)   Special Instructions: none

## 2021-02-14 NOTE — Progress Notes (Signed)
Aeroallergen Immunotherapy   Ordering Provider: Dr. Malachi Bonds   Patient Details  Name: Claudia Winters  MRN: [id number]  Date of Birth: 1991-04-22    Order 2 of 2   Vial Label: RW/Molds   0.3 ml (Volume)  1:20 Concentration -- Ragweed Mix  0.2 ml (Volume)  1:20 Concentration -- Alternaria alternata  0.2 ml (Volume)  1:20 Concentration -- Cladosporium herbarum  0.2 ml (Volume)  1:10 Concentration -- Aspergillus mix  0.2 ml (Volume)  1:10 Concentration -- Penicillium mix    1.1  ml Extract Subtotal  3.9  ml Diluent  5.0  ml Maintenance Total   Schedule:  B   Blue Vial (1:100,000): Schedule B (6 doses)  Yellow Vial (1:10,000): Schedule B (6 doses)  Green Vial (1:1,000): Schedule B (6 doses)  Red Vial (1:100): Schedule B (6 doses)   Special Instructions: none

## 2021-02-15 DIAGNOSIS — J302 Other seasonal allergic rhinitis: Secondary | ICD-10-CM | POA: Diagnosis not present

## 2021-02-16 ENCOUNTER — Ambulatory Visit: Payer: BC Managed Care – PPO

## 2021-02-16 DIAGNOSIS — F900 Attention-deficit hyperactivity disorder, predominantly inattentive type: Secondary | ICD-10-CM | POA: Diagnosis not present

## 2021-02-16 DIAGNOSIS — F3174 Bipolar disorder, in full remission, most recent episode manic: Secondary | ICD-10-CM | POA: Diagnosis not present

## 2021-02-16 DIAGNOSIS — F3131 Bipolar disorder, current episode depressed, mild: Secondary | ICD-10-CM | POA: Diagnosis not present

## 2021-02-20 ENCOUNTER — Ambulatory Visit (INDEPENDENT_AMBULATORY_CARE_PROVIDER_SITE_OTHER): Payer: BC Managed Care – PPO | Admitting: *Deleted

## 2021-02-20 ENCOUNTER — Other Ambulatory Visit: Payer: Self-pay

## 2021-02-20 DIAGNOSIS — J309 Allergic rhinitis, unspecified: Secondary | ICD-10-CM

## 2021-02-20 NOTE — Progress Notes (Signed)
Immunotherapy   Patient Details  Name: Safiyya Stokes MRN: [id number] Date of Birth: Feb 25, 1991  02/20/2021  [name] started injections for  T-W-DM-D, RW-MOLDS Following schedule: B  Frequency:1 time per week Epi-Pen:Epi-Pen Available  Consent signed and patient instructions given.  Patient started allergy injections today. She received 0.75mL of T-W-DM-D in the RUA and 0.76mL of RW-MOLDS in the LUA. Patient waited 30 minutes in office and did not experience any issues.   Adisynn Suleiman Fernandez-Vernon 02/20/2021, 4:11 PM

## 2021-02-23 DIAGNOSIS — L501 Idiopathic urticaria: Secondary | ICD-10-CM | POA: Diagnosis not present

## 2021-02-24 ENCOUNTER — Ambulatory Visit (INDEPENDENT_AMBULATORY_CARE_PROVIDER_SITE_OTHER): Payer: BC Managed Care – PPO | Admitting: *Deleted

## 2021-02-24 ENCOUNTER — Other Ambulatory Visit: Payer: Self-pay

## 2021-02-24 DIAGNOSIS — L501 Idiopathic urticaria: Secondary | ICD-10-CM | POA: Diagnosis not present

## 2021-02-24 DIAGNOSIS — L508 Other urticaria: Secondary | ICD-10-CM

## 2021-03-24 ENCOUNTER — Ambulatory Visit: Payer: BC Managed Care – PPO

## 2021-04-07 ENCOUNTER — Ambulatory Visit: Payer: Medicaid Other | Admitting: Dietician

## 2022-05-22 ENCOUNTER — Ambulatory Visit: Payer: Self-pay | Admitting: Allergy & Immunology

## 2022-05-28 ENCOUNTER — Encounter: Payer: Self-pay | Admitting: Family Medicine

## 2022-05-28 ENCOUNTER — Other Ambulatory Visit: Payer: Self-pay

## 2022-05-28 ENCOUNTER — Ambulatory Visit: Payer: 59 | Admitting: Family Medicine

## 2022-05-28 VITALS — BP 120/72 | HR 80 | Temp 97.8°F | Resp 18 | Ht 65.0 in | Wt 208.8 lb

## 2022-05-28 DIAGNOSIS — J45909 Unspecified asthma, uncomplicated: Secondary | ICD-10-CM | POA: Insufficient documentation

## 2022-05-28 DIAGNOSIS — L508 Other urticaria: Secondary | ICD-10-CM | POA: Insufficient documentation

## 2022-05-28 DIAGNOSIS — J4541 Moderate persistent asthma with (acute) exacerbation: Secondary | ICD-10-CM

## 2022-05-28 DIAGNOSIS — H1010 Acute atopic conjunctivitis, unspecified eye: Secondary | ICD-10-CM | POA: Insufficient documentation

## 2022-05-28 DIAGNOSIS — J3089 Other allergic rhinitis: Secondary | ICD-10-CM

## 2022-05-28 DIAGNOSIS — R040 Epistaxis: Secondary | ICD-10-CM

## 2022-05-28 DIAGNOSIS — B999 Unspecified infectious disease: Secondary | ICD-10-CM

## 2022-05-28 DIAGNOSIS — J302 Other seasonal allergic rhinitis: Secondary | ICD-10-CM

## 2022-05-28 DIAGNOSIS — H1013 Acute atopic conjunctivitis, bilateral: Secondary | ICD-10-CM

## 2022-05-28 DIAGNOSIS — J4540 Moderate persistent asthma, uncomplicated: Secondary | ICD-10-CM | POA: Diagnosis not present

## 2022-05-28 HISTORY — DX: Other urticaria: L50.8

## 2022-05-28 MED ORDER — TRELEGY ELLIPTA 200-62.5-25 MCG/ACT IN AEPB: 1.0000 | INHALATION_SPRAY | Freq: Every day | RESPIRATORY_TRACT | 5 refills | Status: DC

## 2022-05-28 MED ORDER — ALBUTEROL SULFATE HFA 108 (90 BASE) MCG/ACT IN AERS: 1.0000 | INHALATION_SPRAY | Freq: Four times a day (QID) | RESPIRATORY_TRACT | 1 refills | Status: DC | PRN

## 2022-05-28 MED ORDER — MONTELUKAST SODIUM 10 MG PO TABS: 10.0000 mg | ORAL_TABLET | Freq: Every day | ORAL | 5 refills | Status: DC

## 2022-05-28 NOTE — Progress Notes (Signed)
[address] [place] [address] Dept: [phone]  FOLLOW UP NOTE  Patient ID: Claudia Winters, female    DOB: January 16, 1991  Age: 31 y.o. MRN: [id number] Date of Office Visit: 05/28/2022  Assessment  Chief Complaint: Follow-up (Follow up on med refills and having bad sinus headaches.)  HPI Claudia Winters is a 31 year old female who presents the clinic for a follow-up visit.  She was last seen in this clinic on 01/26/2021 by Thermon Leyland, FNP, for evaluation of asthma, allergic rhinitis, allergic conjunctivitis, urticaria and epistaxis.  At today's visit, she reports that her asthma has been moderately well controlled with symptoms beginning over the last several weeks including mild shortness of breath and moderate cough producing clear mucus.  She denies wheeze with rest or activity.  She continues montelukast 10 mg once a day and reports that she is using Trelegy daily, however, is not sure she is using Trelegy 100 over Trelegy 200.  She reports that she lost her albuterol inhaler about 6 months ago.  Allergic rhinitis is reported as poorly controlled with symptoms including clear rhinorrhea, nasal congestion, occasional sneezing, and copious postnasal drainage with frequent throat clearing that began a few weeks ago.  She continues occasional antihistamine and occasional nasal spray.  She is unsure of the name of the nasal spray.  She is not currently using nasal saline.  She had her first and only allergen immunotherapy injection directed toward tree pollen, weed pollen, dust mites, ragweed, and molds in our office on 02/20/2021. She reports that she moves out of state frequently and she does nor experience symptoms of asthma or allergic rhinitis in any state other than [place].  Allergic conjunctivitis is reported as moderately well controlled with occasional red and itchy eyes for which he uses olopatadine with relief of symptoms.  She does report more frequent clear drainage from her right eye down from the  left eye.  She denies vision changes or eye pain.  She denies urticarial flares since her last visit to this clinic. She had her last Xolair injection on 02/24/2021.  She reports epistaxis occurring from either nostril and resolving in under 5 minutes with episodes occurring every several months.  She reports that dry air contributes significantly to epistaxis.  She reports that she has needed antibiotics frequently throughout her life for upper respiratory and lower respiratory infection, however, she reports that she has not had any antibiotics over the last year as she has not had a primary care provider for the last year.  Chart review indicates normal IgG, IgA, and IgM as well as protective titers to diphtheria, tetanus, and 21 out of 23 pneumococcal serotypes on 07/09/2019.  Her current medications are listed in the chart.   Drug Allergies:  Allergies  Allergen Reactions   Corn-Containing Products Swelling and Other (See Comments)    Face goes numb   Lavender Oil Other (See Comments)    Throat swelling    Physical Exam: BP 120/72   Pulse 80   Temp 97.8 F (36.6 C)   Resp 18   Ht 5\' 5"  (1.651 m)   Wt 208 lb 12.8 oz (94.7 kg)   SpO2 97%   BMI 34.75 kg/m    Physical Exam Vitals reviewed.  Constitutional:      Appearance: Normal appearance.  HENT:     Head: Normocephalic and atraumatic.     Right Ear: Tympanic membrane normal.     Left Ear: Tympanic membrane normal.  Nose:     Comments: Bilateral nares edematous and pale with clear nasal drainage noted.  Pharynx slightly erythematous with no exudate.  Ears normal.  Eyes normal. Eyes:     Conjunctiva/sclera: Conjunctivae normal.  Cardiovascular:     Rate and Rhythm: Normal rate and regular rhythm.     Heart sounds: Normal heart sounds. No murmur heard. Pulmonary:     Effort: Pulmonary effort is normal.     Breath sounds: Normal breath sounds.     Comments: Lungs clear to auscultation Musculoskeletal:        General:  Normal range of motion.     Cervical back: Normal range of motion and neck supple.  Skin:    General: Skin is warm and dry.  Neurological:     Mental Status: She is alert and oriented to person, place, and time.  Psychiatric:        Mood and Affect: Mood normal.        Behavior: Behavior normal.        Thought Content: Thought content normal.        Judgment: Judgment normal.     Diagnostics: FVC 1.23, FEV1 1.13.  Predicted FVC 3.31, predicted FEV1 2.81.  Spirometry indicates severe restriction.  Postbronchodilator FVC 1.86, FEV1 1.35.  Postbronchodilator spirometry indicates moderate restriction. Effort questionable and patient with cough  Assessment and Plan: 1. Not well controlled moderate persistent asthma with acute exacerbation   2. Seasonal and perennial allergic rhinitis   3. Seasonal allergic conjunctivitis   4. Chronic urticaria   5. Recurrent infections   6. Epistaxis     Meds ordered this encounter  Medications   albuterol (VENTOLIN HFA) 108 (90 Base) MCG/ACT inhaler    Sig: Inhale 1-2 puffs into the lungs every 6 (six) hours as needed for wheezing or shortness of breath.    Dispense:  1 each    Refill:  1   montelukast (SINGULAIR) 10 MG tablet    Sig: Take 1 tablet (10 mg total) by mouth at bedtime.    Dispense:  30 tablet    Refill:  5   Fluticasone-Umeclidin-Vilant (TRELEGY ELLIPTA) 200-62.5-25 MCG/ACT AEPB    Sig: Inhale 1 puff into the lungs daily.    Dispense:  1 each    Refill:  5    Patient Instructions  Asthma Begin prednisone 10 mg tablets. Take 2 tablets twice a day for 3 days, then take 2 tablets once a day for 1 day, then take 1 tablet on the 5th day, then stop Continue montelukast to 10 mg once a day to prevent cough or wheeze Continue Trelegy 200-1 puff once a day to prevent cough or wheeze Continue albuterol 2 puffs every 4 hours as needed for cough or wheeze OR Instead use albuterol 0.083% solution via nebulizer one unit vial every 4 hours  as needed for cough or wheeze You may use albuterol 2 puffs 5 to 15 minutes before activity to decrease cough or wheeze Lab orders have been placed to help [place] evaluate and treat your asthma. Get the blood work in about 4 weeks. You can do this at your follow up visit  Allergic rhinitis Continue allergen avoidance measures directed toward tree pollen, weed pollen, mold, dust mite, and dog as listed below Continue an antihistamine once a day as needed for runny nose or itch. Remember to rotate to a different antihistamine about every 3 months. Some examples of over the counter antihistamines include Zyrtec (cetirizine), Xyzal (levocetirizine), Allegra (  fexofenadine), and Claritin (loratidine).  Continue Flonase 2 sprays in each nostril once a day as needed for stuffy nose  Continue azelastine 1 spray in each nostril twice a day as needed for nasal symptoms Consider saline nasal rinses as needed for nasal symptoms. Use this before any medicated nasal sprays for best result Begin nasal saline gel as needed for dry nostrils Consider allergen immunotherapy if your symptoms are not well controlled with the treatment plan as listed below  Allergic conjunctivitis Some over the counter eye drops include Pataday one drop in each eye once a day as needed for red, itchy eyes OR Zaditor one drop in each eye twice a day as needed for red itchy eyes. Follow up with an eye exam to evaluate for excessive tear production in the right eye  Chronic urticaria Take the least amount of medications while remaining hive free Allegra 180 mg twice a day and famotidine (Pepcid) 20 mg twice a day. If no symptoms for 7-14 days then decrease to. Allegra 180 mg twice a day and famotidine (Pepcid) 20 mg once a day.  If no symptoms for 7-14 days then decrease to. Allegra 180 mg twice a day.  If no symptoms for 7-14 days then decrease to. Allegra 180 mg once a day.    Epistaxis Pinch both nostrils while leaning forward for at  least 5 minutes before checking to see if the bleeding has stopped. If bleeding is not controlled within 5-10 minutes apply a cotton ball soaked with oxymetazoline (Afrin) to the bleeding nostril for a few seconds.  If the problem persists or worsens a referral to ENT for further evaluation may be necessary.  Recurrent infections Continue to keep track of infections, antibiotics, and steroid use  Call the clinic if this treatment plan is not working well for you.  Follow up in the clinic in 1 month or sooner if needed.  Return in about 4 weeks (around 06/25/2022), or if symptoms worsen or fail to improve.    Thank you for the opportunity to care for this patient.  Please do not hesitate to contact me with questions.  Gareth Morgan, FNP [facility]

## 2022-05-28 NOTE — Patient Instructions (Addendum)
Asthma Begin prednisone 10 mg tablets. Take 2 tablets twice a day for 3 days, then take 2 tablets once a day for 1 day, then take 1 tablet on the 5th day, then stop Continue montelukast to 10 mg once a day to prevent cough or wheeze Continue Trelegy 200-1 puff once a day to prevent cough or wheeze Continue albuterol 2 puffs every 4 hours as needed for cough or wheeze OR Instead use albuterol 0.083% solution via nebulizer one unit vial every 4 hours as needed for cough or wheeze You may use albuterol 2 puffs 5 to 15 minutes before activity to decrease cough or wheeze Lab orders have been placed to help [place] evaluate and treat your asthma. Get the blood work in about 4 weeks. You can do this at your follow up visit  Allergic rhinitis Continue allergen avoidance measures directed toward tree pollen, weed pollen, mold, dust mite, and dog as listed below Continue an antihistamine once a day as needed for runny nose or itch. Remember to rotate to a different antihistamine about every 3 months. Some examples of over the counter antihistamines include Zyrtec (cetirizine), Xyzal (levocetirizine), Allegra (fexofenadine), and Claritin (loratidine).  Continue Flonase 2 sprays in each nostril once a day as needed for stuffy nose  Continue azelastine 1 spray in each nostril twice a day as needed for nasal symptoms Consider saline nasal rinses as needed for nasal symptoms. Use this before any medicated nasal sprays for best result Begin nasal saline gel as needed for dry nostrils Consider allergen immunotherapy if your symptoms are not well controlled with the treatment plan as listed below  Allergic conjunctivitis Some over the counter eye drops include Pataday one drop in each eye once a day as needed for red, itchy eyes OR Zaditor one drop in each eye twice a day as needed for red itchy eyes. Follow up with an eye exam to evaluate for excessive tear production in the right eye  Chronic urticaria Take the  least amount of medications while remaining hive free Allegra 180 mg twice a day and famotidine (Pepcid) 20 mg twice a day. If no symptoms for 7-14 days then decrease to. Allegra 180 mg twice a day and famotidine (Pepcid) 20 mg once a day.  If no symptoms for 7-14 days then decrease to. Allegra 180 mg twice a day.  If no symptoms for 7-14 days then decrease to. Allegra 180 mg once a day.    Epistaxis Pinch both nostrils while leaning forward for at least 5 minutes before checking to see if the bleeding has stopped. If bleeding is not controlled within 5-10 minutes apply a cotton ball soaked with oxymetazoline (Afrin) to the bleeding nostril for a few seconds.  If the problem persists or worsens a referral to ENT for further evaluation may be necessary.  Recurrent infections Continue to keep track of infections, antibiotics, and steroid use  Call the clinic if this treatment plan is not working well for you.  Follow up in the clinic in 1 month or sooner if needed.

## 2022-06-04 ENCOUNTER — Telehealth: Payer: Self-pay

## 2022-06-04 MED ORDER — EPINEPHRINE 0.3 MG/0.3ML IJ SOAJ: INTRAMUSCULAR | 1 refills | Status: DC

## 2022-06-04 NOTE — Telephone Encounter (Signed)
Patient called in - DOB/Pharmacy verified - stated epi pen refill never sent in to [facility] - will be starting allergy immunotherapy soon an wanted to get it ut of the way.  Sending prescription to pharmacy stated above.  Patient also advised she had lab work done and has already had the CBC w/Differential done - will send lab results to provider via myChart. Patient advised she can obtain the IgE blood work from our office  - reviewed blood work protocol with patient.  Patient verbalized understanding, no further questions.

## 2022-11-08 DIAGNOSIS — F900 Attention-deficit hyperactivity disorder, predominantly inattentive type: Secondary | ICD-10-CM | POA: Diagnosis not present

## 2022-11-08 DIAGNOSIS — F410 Panic disorder [episodic paroxysmal anxiety] without agoraphobia: Secondary | ICD-10-CM | POA: Diagnosis not present

## 2022-11-08 DIAGNOSIS — F3132 Bipolar disorder, current episode depressed, moderate: Secondary | ICD-10-CM | POA: Diagnosis not present

## 2022-11-21 DIAGNOSIS — E119 Type 2 diabetes mellitus without complications: Secondary | ICD-10-CM | POA: Diagnosis not present

## 2022-11-21 DIAGNOSIS — F411 Generalized anxiety disorder: Secondary | ICD-10-CM | POA: Diagnosis not present

## 2022-11-21 DIAGNOSIS — F988 Other specified behavioral and emotional disorders with onset usually occurring in childhood and adolescence: Secondary | ICD-10-CM | POA: Diagnosis not present

## 2022-11-21 DIAGNOSIS — Z113 Encounter for screening for infections with a predominantly sexual mode of transmission: Secondary | ICD-10-CM | POA: Diagnosis not present

## 2022-11-21 DIAGNOSIS — R5383 Other fatigue: Secondary | ICD-10-CM | POA: Diagnosis not present

## 2022-11-21 DIAGNOSIS — F314 Bipolar disorder, current episode depressed, severe, without psychotic features: Secondary | ICD-10-CM | POA: Diagnosis not present

## 2022-11-21 DIAGNOSIS — Z6831 Body mass index (BMI) 31.0-31.9, adult: Secondary | ICD-10-CM | POA: Diagnosis not present

## 2022-11-21 DIAGNOSIS — E6609 Other obesity due to excess calories: Secondary | ICD-10-CM | POA: Diagnosis not present

## 2023-07-11 ENCOUNTER — Ambulatory Visit: Payer: 59 | Admitting: Family Medicine

## 2023-07-19 ENCOUNTER — Encounter: Payer: Self-pay | Admitting: Family Medicine

## 2023-07-19 ENCOUNTER — Other Ambulatory Visit: Payer: Self-pay

## 2023-07-19 ENCOUNTER — Ambulatory Visit: Payer: BC Managed Care – PPO | Admitting: Family Medicine

## 2023-07-19 VITALS — BP 104/60 | HR 96 | Temp 98.0°F | Resp 16 | Ht 64.96 in | Wt 159.7 lb

## 2023-07-19 DIAGNOSIS — H1013 Acute atopic conjunctivitis, bilateral: Secondary | ICD-10-CM | POA: Diagnosis not present

## 2023-07-19 DIAGNOSIS — J3089 Other allergic rhinitis: Secondary | ICD-10-CM

## 2023-07-19 DIAGNOSIS — J4551 Severe persistent asthma with (acute) exacerbation: Secondary | ICD-10-CM

## 2023-07-19 DIAGNOSIS — L508 Other urticaria: Secondary | ICD-10-CM

## 2023-07-19 DIAGNOSIS — H1010 Acute atopic conjunctivitis, unspecified eye: Secondary | ICD-10-CM

## 2023-07-19 DIAGNOSIS — B999 Unspecified infectious disease: Secondary | ICD-10-CM

## 2023-07-19 DIAGNOSIS — J302 Other seasonal allergic rhinitis: Secondary | ICD-10-CM

## 2023-07-19 MED ORDER — ALBUTEROL SULFATE HFA 108 (90 BASE) MCG/ACT IN AERS: 1.0000 | INHALATION_SPRAY | Freq: Four times a day (QID) | RESPIRATORY_TRACT | 1 refills | Status: DC | PRN

## 2023-07-19 MED ORDER — ALBUTEROL SULFATE (2.5 MG/3ML) 0.083% IN NEBU: 2.5000 mg | INHALATION_SOLUTION | RESPIRATORY_TRACT | 1 refills | Status: DC | PRN

## 2023-07-19 MED ORDER — MONTELUKAST SODIUM 10 MG PO TABS: 10.0000 mg | ORAL_TABLET | Freq: Every day | ORAL | 5 refills | Status: DC

## 2023-07-19 MED ORDER — TRELEGY ELLIPTA 200-62.5-25 MCG/ACT IN AEPB: 1.0000 | INHALATION_SPRAY | Freq: Every day | RESPIRATORY_TRACT | 5 refills | Status: DC

## 2023-07-19 NOTE — Progress Notes (Addendum)
[address] [place] [address] Dept: [phone]  FOLLOW UP NOTE  Patient ID: Claudia Winters, female    DOB: [birth year]  Age: 33 y.o. MRN: [id number] Date of Office Visit: 07/19/2023  Assessment  Chief Complaint: Asthma (Is out of meds. States that her asthma has been between fair and poor, but believes that the weather is affecting her.), Urticaria (Has really been having any except for when she's kind of sick and the weather is changing, but states that it's usually during spring.), and Allergic Rhinitis  (Is out of allergy meds. States that every other month she has a sinus infection and has been on medication and antibiotics.)  HPI Claudia Winters is a 33 year old female who presents to the clinic for follow-up visit.  She was last seen in this clinic on 05/28/2022 by Thermon Leyland, FNP, for evaluation of asthma, allergic rhinitis, allergic conjunctivitis, urticaria, epistaxis, and recurrent infection.   At today's visit, she reports her asthma has been poorly controlled with dry cough as the main symptom over the last 2 days.  She denies shortness of breath or wheeze with activity or rest.  She reports that she has been out of her medications for asthma including Trelegy 200, montelukast, and albuterol for the last 1-1/2 months.  She is interested in restarting these medications at today's visit.   Allergic rhinitis is reported as moderately well-controlled with clear rhinorrhea and nasal congestion as the main symptoms.  She reports that she does not usually experience symptoms in the winter, however, experiences symptoms beginning in the spring.  She is not currently taking any medications at this time, however, would like to restart these medications including azelastine, Flonase, and antihistamine.  Her last environmental allergy testing via lab was on 01/27/2021 and was positive to pollen, mold, dust mite, and dog.  Allergic conjunctivitis is reported as moderately well-controlled with  occasional red and itchy eyes for which she continues an over-the-counter eyedrop only as needed which is infrequently.  She reports intermittent allergic urticaria breakouts occurring mainly in the spring which resolved with antihistamine use.  She denies concomitant cardiopulmonary or gastrointestinal symptoms with these breakouts.  She reports frequent sinus infections for which she continues to receive antibiotics and steroids from her primary care provider.  She reports that about every other month she receives some sort of medical intervention for sinus issues.  She has previously had an immune screening with our clinic on 07/09/2019 which indicated normal immunoglobulin levels and protective titers to diphtheria, tetanus, and 21 out of 23 protective strep pneumoniae titers.  She is not currently seeing an ENT specialist.  She last received Avelox on 06/11/2023 from [facility] via televisit.  Her current medications are listed in the chart.  Drug Allergies:  Allergies  Allergen Reactions   Corn-Containing Products Swelling and Other (See Comments)    Face goes numb   Lavender Oil Other (See Comments)    Throat swelling    Physical Exam: BP 104/60 (BP Location: Right Arm, Patient Position: Sitting, Cuff Size: Normal)   Pulse 96   Temp 98 F (36.7 C) (Temporal)   Resp 16   Ht 5' 4.96" (1.65 m)   Wt 159 lb 11.2 oz (72.4 kg)   SpO2 100%   BMI 26.61 kg/m    Physical Exam Vitals reviewed.  Constitutional:      Appearance: Normal appearance.  HENT:     Head: Normocephalic and atraumatic.     Right Ear:  Tympanic membrane normal.     Left Ear: Tympanic membrane normal.     Nose:     Comments: Bilateral nares slightly erythematous with thin clear nasal drainage noted.  Pharynx normal.  Ears normal.  Eyes normal.    Mouth/Throat:     Pharynx: Oropharynx is clear.  Eyes:     Conjunctiva/sclera: Conjunctivae normal.  Cardiovascular:     Rate and  Rhythm: Normal rate and regular rhythm.     Heart sounds: Normal heart sounds. No murmur heard. Pulmonary:     Effort: Pulmonary effort is normal.     Breath sounds: Normal breath sounds.     Comments: Lungs clear to auscultation Musculoskeletal:        General: Normal range of motion.     Cervical back: Normal range of motion and neck supple.  Skin:    General: Skin is warm and dry.  Neurological:     Mental Status: She is alert and oriented to person, place, and time.  Psychiatric:        Mood and Affect: Mood normal.        Behavior: Behavior normal.        Thought Content: Thought content normal.        Judgment: Judgment normal.     Diagnostics: FVC 2.94 which is 87% of predicted value, FEV1 1.07 which is 37% of predicted value.  Spirometry indicates severe airway obstruction.  Postbronchodilator spirometry indicates 12% improvement in FEV1.  Assessment and Plan: 1. Not well controlled severe persistent asthma with acute exacerbation   2. Seasonal and perennial allergic rhinitis   3. Seasonal allergic conjunctivitis   4. Recurrent infections   5. Chronic urticaria     Meds ordered this encounter  Medications   albuterol (PROVENTIL) (2.5 MG/3ML) 0.083% nebulizer solution    Sig: Take 3 mLs (2.5 mg total) by nebulization every 4 (four) hours as needed for wheezing or shortness of breath.    Dispense:  75 mL    Refill:  1   albuterol (VENTOLIN HFA) 108 (90 Base) MCG/ACT inhaler    Sig: Inhale 1-2 puffs into the lungs every 6 (six) hours as needed for wheezing or shortness of breath.    Dispense:  1 each    Refill:  1   Fluticasone-Umeclidin-Vilant (TRELEGY ELLIPTA) 200-62.5-25 MCG/ACT AEPB    Sig: Inhale 1 puff into the lungs daily.    Dispense:  1 each    Refill:  5   montelukast (SINGULAIR) 10 MG tablet    Sig: Take 1 tablet (10 mg total) by mouth at bedtime.    Dispense:  30 tablet    Refill:  5    Patient Instructions  Asthma Begin prednisone 10 mg tablets.  Take 2 tablets twice a day for 3 days, then take 2 tablets once a day for 1 day, then take 1 tablet on the 5th day, then stop Restart montelukast to 10 mg once a day to prevent cough or wheeze Restart Trelegy 200-1 puff once a day to prevent cough or wheeze Continue albuterol 2 puffs every 4 hours as needed for cough or wheeze OR Instead use albuterol 0.083% solution via nebulizer one unit vial every 4 hours as needed for cough or wheeze You may use albuterol 2 puffs 5 to 15 minutes before activity to decrease cough or wheeze Consider a biologic therapy to control your asthma.  A lab test has been entered to help [place] evaluate if you qualify for a biologic  therapy at this time.  We will call you when the result becomes available.  Allergic rhinitis Continue allergen avoidance measures directed toward tree pollen, weed pollen, mold, dust mite, and dog as listed below Continue an antihistamine once a day as needed for runny nose or itch. Remember to rotate to a different antihistamine about every 3 months. Some examples of over the counter antihistamines include Zyrtec (cetirizine), Xyzal (levocetirizine), Allegra (fexofenadine), and Claritin (loratidine).  Continue Flonase 2 sprays in each nostril once a day as needed for stuffy nose  Continue azelastine 1 spray in each nostril twice a day as needed for nasal symptoms Consider saline nasal rinses as needed for nasal symptoms. Use this before any medicated nasal sprays for best result Begin nasal saline gel as needed for dry nostrils Consider allergen immunotherapy if your symptoms are not well controlled with the treatment plan as listed below  Allergic conjunctivitis Some over the counter eye drops include Pataday one drop in each eye once a day as needed for red, itchy eyes OR Zaditor one drop in each eye twice a day as needed for red itchy eyes. Follow up with an eye exam to evaluate for excessive tear production in the right eye  Chronic  urticaria Take the least amount of medications while remaining hive free Allegra 180 mg twice a day and famotidine (Pepcid) 20 mg twice a day. If no symptoms for 7-14 days then decrease to. Allegra 180 mg twice a day and famotidine (Pepcid) 20 mg once a day.  If no symptoms for 7-14 days then decrease to. Allegra 180 mg twice a day.  If no symptoms for 7-14 days then decrease to. Allegra 180 mg once a day.    Epistaxis Pinch both nostrils while leaning forward for at least 5 minutes before checking to see if the bleeding has stopped. If bleeding is not controlled within 5-10 minutes apply a cotton ball soaked with oxymetazoline (Afrin) to the bleeding nostril for a few seconds.  If the problem persists or worsens a referral to ENT for further evaluation may be necessary.  Recurrent infections Continue to keep track of infections, antibiotics, and steroid use Consider an ENT referral for evaluation of frequent sinus infections requiring antibiotics and steroids  Call the clinic if this treatment plan is not working well for you.  Follow up in the clinic in 2 months or sooner if needed.  Return in about 2 months (around 09/16/2023), or if symptoms worsen or fail to improve.    Thank you for the opportunity to care for this patient.  Please do not hesitate to contact me with questions.  Thermon Leyland, FNP [facility]

## 2023-07-19 NOTE — Patient Instructions (Addendum)
Asthma Begin prednisone 10 mg tablets. Take 2 tablets twice a day for 3 days, then take 2 tablets once a day for 1 day, then take 1 tablet on the 5th day, then stop Restart montelukast to 10 mg once a day to prevent cough or wheeze Restart Trelegy 200-1 puff once a day to prevent cough or wheeze Continue albuterol 2 puffs every 4 hours as needed for cough or wheeze OR Instead use albuterol 0.083% solution via nebulizer one unit vial every 4 hours as needed for cough or wheeze You may use albuterol 2 puffs 5 to 15 minutes before activity to decrease cough or wheeze Consider a biologic therapy to control your asthma.  A lab test has been entered to help [place] evaluate if you qualify for a biologic therapy at this time.  We will call you when the result becomes available.  Allergic rhinitis Continue allergen avoidance measures directed toward tree pollen, weed pollen, mold, dust mite, and dog as listed below Continue an antihistamine once a day as needed for runny nose or itch. Remember to rotate to a different antihistamine about every 3 months. Some examples of over the counter antihistamines include Zyrtec (cetirizine), Xyzal (levocetirizine), Allegra (fexofenadine), and Claritin (loratidine).  Continue Flonase 2 sprays in each nostril once a day as needed for stuffy nose  Continue azelastine 1 spray in each nostril twice a day as needed for nasal symptoms Consider saline nasal rinses as needed for nasal symptoms. Use this before any medicated nasal sprays for best result Begin nasal saline gel as needed for dry nostrils Consider allergen immunotherapy if your symptoms are not well controlled with the treatment plan as listed below  Allergic conjunctivitis Some over the counter eye drops include Pataday one drop in each eye once a day as needed for red, itchy eyes OR Zaditor one drop in each eye twice a day as needed for red itchy eyes. Follow up with an eye exam to evaluate for excessive tear  production in the right eye  Chronic urticaria Take the least amount of medications while remaining hive free Allegra 180 mg twice a day and famotidine (Pepcid) 20 mg twice a day. If no symptoms for 7-14 days then decrease to. Allegra 180 mg twice a day and famotidine (Pepcid) 20 mg once a day.  If no symptoms for 7-14 days then decrease to. Allegra 180 mg twice a day.  If no symptoms for 7-14 days then decrease to. Allegra 180 mg once a day.    Epistaxis Pinch both nostrils while leaning forward for at least 5 minutes before checking to see if the bleeding has stopped. If bleeding is not controlled within 5-10 minutes apply a cotton ball soaked with oxymetazoline (Afrin) to the bleeding nostril for a few seconds.  If the problem persists or worsens a referral to ENT for further evaluation may be necessary.  Recurrent infections Continue to keep track of infections, antibiotics, and steroid use Consider an ENT referral for evaluation of frequent sinus infections requiring antibiotics and steroids  Call the clinic if this treatment plan is not working well for you.  Follow up in the clinic in 2 months or sooner if needed.

## 2023-07-23 LAB — CBC WITH DIFFERENTIAL/PLATELET
Basophils Absolute: 0 10*3/uL (ref 0.0–0.2)
Basos: 1 %
EOS (ABSOLUTE): 0.2 10*3/uL (ref 0.0–0.4)
Eos: 4 %
Hematocrit: 37.5 % (ref 34.0–46.6)
Hemoglobin: 12 g/dL (ref 11.1–15.9)
Immature Grans (Abs): 0 10*3/uL (ref 0.0–0.1)
Immature Granulocytes: 0 %
Lymphocytes Absolute: 2.6 10*3/uL (ref 0.7–3.1)
Lymphs: 57 %
MCH: 28 pg (ref 26.6–33.0)
MCHC: 32 g/dL (ref 31.5–35.7)
MCV: 87 fL (ref 79–97)
Monocytes Absolute: 0.3 10*3/uL (ref 0.1–0.9)
Monocytes: 5 %
Neutrophils Absolute: 1.5 10*3/uL (ref 1.4–7.0)
Neutrophils: 33 %
Platelets: 323 10*3/uL (ref 150–450)
RBC: 4.29 x10E6/uL (ref 3.77–5.28)
RDW: 13.6 % (ref 11.7–15.4)
WBC: 4.7 10*3/uL (ref 3.4–10.8)

## 2023-07-23 LAB — IGE: IgE (Immunoglobulin E), Serum: 87 [IU]/mL (ref 6–495)

## 2023-07-29 ENCOUNTER — Encounter: Payer: Self-pay | Admitting: Family Medicine

## 2023-07-29 NOTE — Progress Notes (Signed)
Can you please let this patient know that her lab work indicates she is eligible for an anti-IL5 and an anti-IgE biologic therapy based on labs. Please have her continue to use her asthma maintenance inhaler and we can discuss biologic therapy at her follow up appointment. Thank you

## 2023-07-29 NOTE — Telephone Encounter (Signed)
Can you please let this patietn know that she will need to show that she is taking her asthma maintenance inhaler and still not well controlled before we can move forward with biologic therapy. Please have her continue to use her asthma maintenance inhaler and we can discuss biologic therapy at her follow up visit. Thank you

## 2023-07-30 MED ORDER — PREDNISONE 10 MG PO TABS: ORAL_TABLET | ORAL | 0 refills | Status: DC

## 2023-07-30 MED ORDER — FAMOTIDINE 20 MG PO TABS: 20.0000 mg | ORAL_TABLET | Freq: Two times a day (BID) | ORAL | 5 refills | Status: AC | PRN

## 2023-07-30 MED ORDER — FEXOFENADINE HCL 180 MG PO TABS: 180.0000 mg | ORAL_TABLET | Freq: Two times a day (BID) | ORAL | 5 refills | Status: AC | PRN

## 2023-07-30 NOTE — Addendum Note (Signed)
Addended by: Elsworth Soho on: 07/30/2023 10:37 AM   Modules accepted: Orders

## 2023-08-15 NOTE — Telephone Encounter (Signed)
Please let this patient know that she needs to show that she has been using her asthma controller medication consistently. If she does not have any improvement in her breathing with regular maintenance inhaler use we can move forward with the biologic. Her asthma would need to be well controlled before addressing allergen immunotherapy. Please stress the importance of her follow up visit. Thank you

## 2023-09-12 NOTE — Progress Notes (Signed)
 [address] [place] [address] Dept: [phone]  FOLLOW UP NOTE  Patient ID: Claudia Winters, female    DOB: February 16, 1991  Age: 33 y.o. MRN: [id number] Date of Office Visit: 09/16/2023  Assessment  Chief Complaint: Asthma, Allergic Rhinitis , and Urticaria  HPI Claudia Winters is a 33 year old female who presents to the clinic for a follow-up visit.  She was last seen in this clinic on 07/19/2023 by Thermon Leyland, FNP, for evaluation of poorly controlled asthma, allergic rhinitis, allergic conjunctivitis, urticaria, epistaxis, and recurrent sinus infection.  At today's visit, she reports her asthma has been moderately well-controlled with symptoms including shortness of breath with activity and dry cough occurring intermittently.  She denies wheeze.  She does report that she is currnetly limiting activity due to asthma symptoms. She continues Trelegy 200-1 puff once a day, montelukast 10 mg once a day, and occasionally uses albuterol, mostly after being outside for extended period of time.  She does report that strong odors aggravate her asthma.  She has previously received Xolair injections for asthma control as well as chronic urticaria control and is interested in restarting this medication at this time.  Her last IgE was 87 and environmental allergy skin testing was positive to perennial allergens on 01/27/2021.  EpiPen is out of date and will be reordered at today's visit.  Allergic rhinitis is reported as moderately well-controlled with symptoms including rhinorrhea, nasal congestion, postnasal drainage, and throat itching and tightness.  She continues Allegra 180 mg daily and is not currently using Flonase.  She is not currently using a nasal saline rinse. Her last environmental allergy testing via lab was on 01/27/2021 and was positive to pollen, mold, dust mite, and dog.  She is interested in resuming allergen immunotherapy once her asthma is more well-controlled.   Allergic conjunctivitis is  reported as moderately well-controlled with occasional clear drainage occurring from either eye.  She denies redness or itch at this time.  She is using some lubricating eyedrops, however, is not currently using an allergy eyedrop for control of allergic conjunctivitis.    She reports frequent itch occurring on her arms and back and intermittent hives for which she continues famotidine and Allegra daily with relief of symptoms.  She denies concomitant cardiopulmonary or gastrointestinal symptoms with these hives.  She denies infection requiring antibiotics since her last visit to this clinic.  She has previously had an immune screening with our clinic on 07/09/2019 which indicated normal immunoglobulin levels and protective titers to diphtheria, tetanus, and 21 out of 23 protective strep pneumoniae titers.   She continues to experience infrequent episodes of epistaxis and is not currently using any nasal saline products.  Her current medications are listed in the chart.  Drug Allergies:  Allergies  Allergen Reactions   Corn-Containing Products Swelling and Other (See Comments)    Face goes numb   Lavender Oil Other (See Comments)    Throat swelling    Physical Exam: BP 112/64 (BP Location: Left Arm, Patient Position: Sitting, Cuff Size: Normal)   Pulse 75   Temp 98.2 F (36.8 C) (Temporal)   Resp 16   SpO2 98%    Physical Exam Vitals reviewed.  Constitutional:      Appearance: Normal appearance.  HENT:     Head: Normocephalic and atraumatic.     Right Ear: Tympanic membrane normal.     Left Ear: Tympanic membrane normal.     Nose:     Comments: Bilateral naris slightly erythematous  with thin clear nasal drainage noted.  Pharynx slightly erythematous with no exudate.  Ears normal.  Eyes normal.    Mouth/Throat:     Pharynx: Oropharynx is clear.  Eyes:     Conjunctiva/sclera: Conjunctivae normal.  Cardiovascular:     Rate and Rhythm: Normal rate and regular rhythm.     Heart  sounds: Normal heart sounds. No murmur heard. Pulmonary:     Effort: Pulmonary effort is normal.     Breath sounds: Normal breath sounds.     Comments: Lungs clear to auscultation Musculoskeletal:        General: Normal range of motion.     Cervical back: Normal range of motion and neck supple.  Skin:    General: Skin is warm and dry.  Neurological:     Mental Status: She is alert and oriented to person, place, and time.  Psychiatric:        Mood and Affect: Mood normal.        Behavior: Behavior normal.        Thought Content: Thought content normal.        Judgment: Judgment normal.     Diagnostics: FVC 3.10 which is 92% of predicted value, FEV1 2.33 which is 82% of predicted value.  Spirometry indicates normal ventilatory function.  Assessment and Plan: 1. Not well controlled severe persistent asthma with acute exacerbation   2. Seasonal and perennial allergic rhinitis   3. Seasonal allergic conjunctivitis   4. Chronic urticaria   5. Recurrent infections   6. Epistaxis     Meds ordered this encounter  Medications   EPINEPHrine 0.3 mg/0.3 mL IJ SOAJ injection    Sig: Use as directed for severe allergic reactions    Dispense:  2 each    Refill:  1    Please dispense Mylan or Teva brand generic only. Thank you.    Patient Instructions  Asthma Continue montelukast to 10 mg once a day to prevent cough or wheeze Continue Trelegy 200-1 puff once a day to prevent cough or wheeze Continue albuterol 2 puffs every 4 hours as needed for cough or wheeze OR Instead use albuterol 0.083% solution via nebulizer one unit vial every 4 hours as needed for cough or wheeze You may use albuterol 2 puffs 5 to 15 minutes before activity to decrease cough or wheeze Restart Xolair for asthma control. You will next hear from our Xolair coordinator, Tammy, with net steps.   Allergic rhinitis Continue allergen avoidance measures directed toward tree pollen, weed pollen, mold, dust mite, and  dog as listed below Continue an antihistamine once a day as needed for runny nose or itch. Remember to rotate to a different antihistamine about every 3 months. Some examples of over the counter antihistamines include Zyrtec (cetirizine), Xyzal (levocetirizine), Allegra (fexofenadine), and Claritin (loratidine).  Continue Flonase 2 sprays in each nostril once a day as needed for stuffy nose  Continue azelastine 1 spray in each nostril twice a day as needed for nasal symptoms Consider saline nasal rinses as needed for nasal symptoms. Use this before any medicated nasal sprays for best result Begin nasal saline gel as needed for dry nostrils Consider allergen immunotherapy if your symptoms are not well controlled with the treatment plan as listed below  Allergic conjunctivitis Some over the counter eye drops include Pataday one drop in each eye once a day as needed for red, itchy eyes OR Zaditor one drop in each eye twice a day as needed for red  itchy eyes. Follow up with an eye exam to evaluate for excessive tear production in the right eye  Chronic urticaria Take the least amount of medications while remaining hive free Allegra 180 mg twice a day and famotidine (Pepcid) 20 mg twice a day. If no symptoms for 7-14 days then decrease to. Allegra 180 mg twice a day and famotidine (Pepcid) 20 mg once a day.  If no symptoms for 7-14 days then decrease to. Allegra 180 mg twice a day.  If no symptoms for 7-14 days then decrease to. Allegra 180 mg once a day.    Epistaxis Pinch both nostrils while leaning forward for at least 5 minutes before checking to see if the bleeding has stopped. If bleeding is not controlled within 5-10 minutes apply a cotton ball soaked with oxymetazoline (Afrin) to the bleeding nostril for a few seconds.  If the problem persists or worsens a referral to ENT for further evaluation may be necessary.  Recurrent infections Continue to keep track of infections, antibiotics, and  steroid use Consider an ENT referral for evaluation of frequent sinus infections requiring antibiotics and steroids  Call the clinic if this treatment plan is not working well for you.  Follow up in the clinic in 3 or months or sooner if needed.  Return in about 3 months (around 12/17/2023), or if symptoms worsen or fail to improve.    Thank you for the opportunity to care for this patient.  Please do not hesitate to contact me with questions.  Thermon Leyland, FNP [facility]

## 2023-09-12 NOTE — Patient Instructions (Addendum)
 Asthma Continue montelukast to 10 mg once a day to prevent cough or wheeze Continue Trelegy 200-1 puff once a day to prevent cough or wheeze Continue albuterol 2 puffs every 4 hours as needed for cough or wheeze OR Instead use albuterol 0.083% solution via nebulizer one unit vial every 4 hours as needed for cough or wheeze You may use albuterol 2 puffs 5 to 15 minutes before activity to decrease cough or wheeze Restart Xolair for asthma control. You will next hear from our Xolair coordinator, Tammy, with net steps.   Allergic rhinitis Continue allergen avoidance measures directed toward tree pollen, weed pollen, mold, dust mite, and dog as listed below Continue an antihistamine once a day as needed for runny nose or itch. Remember to rotate to a different antihistamine about every 3 months. Some examples of over the counter antihistamines include Zyrtec (cetirizine), Xyzal (levocetirizine), Allegra (fexofenadine), and Claritin (loratidine).  Continue Flonase 2 sprays in each nostril once a day as needed for stuffy nose  Continue azelastine 1 spray in each nostril twice a day as needed for nasal symptoms Consider saline nasal rinses as needed for nasal symptoms. Use this before any medicated nasal sprays for best result Begin nasal saline gel as needed for dry nostrils Consider allergen immunotherapy if your symptoms are not well controlled with the treatment plan as listed below  Allergic conjunctivitis Some over the counter eye drops include Pataday one drop in each eye once a day as needed for red, itchy eyes OR Zaditor one drop in each eye twice a day as needed for red itchy eyes. Follow up with an eye exam to evaluate for excessive tear production in the right eye  Chronic urticaria Take the least amount of medications while remaining hive free Allegra 180 mg twice a day and famotidine (Pepcid) 20 mg twice a day. If no symptoms for 7-14 days then decrease to. Allegra 180 mg twice a day and  famotidine (Pepcid) 20 mg once a day.  If no symptoms for 7-14 days then decrease to. Allegra 180 mg twice a day.  If no symptoms for 7-14 days then decrease to. Allegra 180 mg once a day.    Epistaxis Pinch both nostrils while leaning forward for at least 5 minutes before checking to see if the bleeding has stopped. If bleeding is not controlled within 5-10 minutes apply a cotton ball soaked with oxymetazoline (Afrin) to the bleeding nostril for a few seconds.  If the problem persists or worsens a referral to ENT for further evaluation may be necessary.  Recurrent infections Continue to keep track of infections, antibiotics, and steroid use Consider an ENT referral for evaluation of frequent sinus infections requiring antibiotics and steroids  Call the clinic if this treatment plan is not working well for you.  Follow up in the clinic in 3 or months or sooner if needed.

## 2023-09-16 ENCOUNTER — Encounter: Payer: Self-pay | Admitting: Family Medicine

## 2023-09-16 ENCOUNTER — Ambulatory Visit: Payer: BC Managed Care – PPO | Admitting: Family Medicine

## 2023-09-16 ENCOUNTER — Other Ambulatory Visit: Payer: Self-pay

## 2023-09-16 ENCOUNTER — Ambulatory Visit: Admitting: Family Medicine

## 2023-09-16 VITALS — BP 112/64 | HR 75 | Temp 98.2°F | Resp 16

## 2023-09-16 DIAGNOSIS — H1013 Acute atopic conjunctivitis, bilateral: Secondary | ICD-10-CM | POA: Diagnosis not present

## 2023-09-16 DIAGNOSIS — H1010 Acute atopic conjunctivitis, unspecified eye: Secondary | ICD-10-CM

## 2023-09-16 DIAGNOSIS — R040 Epistaxis: Secondary | ICD-10-CM

## 2023-09-16 DIAGNOSIS — J302 Other seasonal allergic rhinitis: Secondary | ICD-10-CM

## 2023-09-16 DIAGNOSIS — L508 Other urticaria: Secondary | ICD-10-CM

## 2023-09-16 DIAGNOSIS — J4551 Severe persistent asthma with (acute) exacerbation: Secondary | ICD-10-CM | POA: Diagnosis not present

## 2023-09-16 DIAGNOSIS — B999 Unspecified infectious disease: Secondary | ICD-10-CM

## 2023-09-16 DIAGNOSIS — J3089 Other allergic rhinitis: Secondary | ICD-10-CM

## 2023-09-16 MED ORDER — EPINEPHRINE 0.3 MG/0.3ML IJ SOAJ: INTRAMUSCULAR | 1 refills | Status: DC

## 2023-11-19 ENCOUNTER — Other Ambulatory Visit: Payer: Self-pay

## 2023-11-19 ENCOUNTER — Other Ambulatory Visit (HOSPITAL_COMMUNITY): Payer: Self-pay

## 2023-11-19 ENCOUNTER — Telehealth: Payer: Self-pay | Admitting: *Deleted

## 2023-11-19 MED ORDER — XOLAIR 300 MG/2ML ~~LOC~~ SOSY
300.0000 mg | PREFILLED_SYRINGE | SUBCUTANEOUS | 11 refills | Status: DC
  Filled 2023-11-21: qty 2, 28d supply, fill #0

## 2023-11-19 NOTE — Telephone Encounter (Signed)
 L/m for patient to contact me to advised approval , copay card and submit to [facility].

## 2023-11-19 NOTE — Telephone Encounter (Signed)
 Patient called about restarting Xolair .  Advised her I had not gotten message regarding same but will get approval and reach out. I

## 2023-11-20 NOTE — Telephone Encounter (Signed)
 Spoke to patient and advised approval and submit to [place] long and will reach out once delivery set to make appt to restart Xolair 

## 2023-11-21 ENCOUNTER — Other Ambulatory Visit: Payer: Self-pay

## 2023-11-21 NOTE — Progress Notes (Signed)
 Patient prefers auto-injector pen due to fear of needles, she is already on an Ozempic with the auto-injector and is comfortable with this. Patient is calling Tammy to discuss change. Patient is okay with doing syringe for first dose in office if needed but would like to avoid going in office multiple times for training.

## 2023-11-21 NOTE — Progress Notes (Signed)
 Specialty Pharmacy Initial Fill Coordination Note  Claudia Winters is a 33 y.o. female contacted today regarding initial fill of specialty medication(s) Omalizumab  (Xolair )   Patient requested Courier to Provider Office   Delivery date: 11/27/23   Verified address: [address] [place] [address]   Medication will be filled on 05/27.   Patient is aware of $0.00 copayment.

## 2023-11-21 NOTE — Progress Notes (Signed)
 Specialty Pharmacy Initiation Note   Claudia Winters is a 33 y.o. female who will be followed by the specialty pharmacy service for RxSp Asthma/COPD    Review of administration, indication, effectiveness, safety, potential side effects, storage/disposable, and missed dose instructions occurred today for patient's specialty medication(s) Omalizumab  (Xolair )     Patient/Caregiver did not have any additional questions or concerns.   Patient's therapy is appropriate to: Initiate    Goals Addressed             This Visit's Progress    Reduce disease symptoms including coughing and shortness of breath       Patient is initiating therapy. Patient will maintain adherence. Patient was previously on therapy and doing well. Discontinued temporarily for personal reasons.          Ayden Apodaca M Kila Godina Specialty Pharmacist

## 2023-11-26 ENCOUNTER — Other Ambulatory Visit: Payer: Self-pay

## 2023-12-10 NOTE — Progress Notes (Signed)
   [address] [place] [address] Dept: [phone]  FOLLOW UP NOTE  Patient ID: Claudia Winters, female    DOB: 1991/05/12  Age: 33 y.o. MRN: [id number] Date of Office Visit: 12/12/2023  Assessment  Chief Complaint: No chief complaint on file.  HPI Claudia Winters is a 33 year old female who presents to the clinic for follow up visit.  She was last seen in the 09/16/2023 by mammograms, FNP, for evaluation of asthma, allergic rhinitis, allergic conjunctivitis, urticaria, epistaxis, and recurrent infection. Her last environmental allergy testing via lab was on 01/27/2021 and was positive to pollen, mold, dust mite, and dog.   She has previously had an immune screening with our clinic on 07/09/2019 which indicated normal immunoglobulin levels and protective titers to diphtheria, tetanus, and 21 out of 23 protective strep pneumoniae titers.  Discussed the use of AI scribe software for clinical note transcription with the patient, who gave verbal consent to proceed.  History of Present Illness      Drug Allergies:  Allergies  Allergen Reactions   Corn-Containing Products Swelling and Other (See Comments)    Face goes numb   Lavender Oil Other (See Comments)    Throat swelling    Physical Exam: There were no vitals taken for this visit.   Physical Exam  Diagnostics:    Assessment and Plan: No diagnosis found.  No orders of the defined types were placed in this encounter.   There are no Patient Instructions on file for this visit.  No follow-ups on file.    Thank you for the opportunity to care for this patient.  Please do not hesitate to contact me with questions.  Marinus Sic, FNP [facility]

## 2023-12-10 NOTE — Addendum Note (Signed)
 Addended by: Usman Millett M on: 12/10/2023 11:54 AM   Modules accepted: Orders, Level of Service

## 2023-12-10 NOTE — Patient Instructions (Incomplete)
 Asthma Continue montelukast  to 10 mg once a day to prevent cough or wheeze Continue Trelegy 200-1 puff once a day to prevent cough or wheeze Continue albuterol  2 puffs every 4 hours as needed for cough or wheeze OR Instead use albuterol  0.083% solution via nebulizer one unit vial every 4 hours as needed for cough or wheeze You may use albuterol  2 puffs 5 to 15 minutes before activity to decrease cough or wheeze Restart Xolair  for asthma control. You will next hear from our Xolair  coordinator, Tammy, with net steps.   Allergic rhinitis Continue allergen avoidance measures directed toward tree pollen, weed pollen, mold, dust mite, and dog as listed below Continue an antihistamine once a day as needed for runny nose or itch. Remember to rotate to a different antihistamine about every 3 months. Some examples of over the counter antihistamines include Zyrtec (cetirizine), Xyzal (levocetirizine), Allegra  (fexofenadine ), and Claritin (loratidine).  Continue Flonase 2 sprays in each nostril once a day as needed for stuffy nose  Continue azelastine 1 spray in each nostril twice a day as needed for nasal symptoms Consider saline nasal rinses as needed for nasal symptoms. Use this before any medicated nasal sprays for best result Begin nasal saline gel as needed for dry nostrils Consider allergen immunotherapy if your symptoms are not well controlled with the treatment plan as listed below  Allergic conjunctivitis Some over the counter eye drops include Pataday one drop in each eye once a day as needed for red, itchy eyes OR Zaditor one drop in each eye twice a day as needed for red itchy eyes. Follow up with an eye exam to evaluate for excessive tear production in the right eye  Chronic urticaria Take the least amount of medications while remaining hive free Allegra  180 mg twice a day and famotidine  (Pepcid ) 20 mg twice a day. If no symptoms for 7-14 days then decrease to. Allegra  180 mg twice a day and  famotidine  (Pepcid ) 20 mg once a day.  If no symptoms for 7-14 days then decrease to. Allegra  180 mg twice a day.  If no symptoms for 7-14 days then decrease to. Allegra  180 mg once a day.    Epistaxis Pinch both nostrils while leaning forward for at least 5 minutes before checking to see if the bleeding has stopped. If bleeding is not controlled within 5-10 minutes apply a cotton ball soaked with oxymetazoline (Afrin) to the bleeding nostril for a few seconds.  If the problem persists or worsens a referral to ENT for further evaluation may be necessary.  Recurrent infections Continue to keep track of infections, antibiotics, and steroid use Consider an ENT referral for evaluation of frequent sinus infections requiring antibiotics and steroids  Call the clinic if this treatment plan is not working well for you.  Follow up in the clinic in 3 or months or sooner if needed.

## 2023-12-11 ENCOUNTER — Other Ambulatory Visit: Payer: Self-pay

## 2023-12-11 ENCOUNTER — Other Ambulatory Visit (HOSPITAL_COMMUNITY): Payer: Self-pay

## 2023-12-11 MED ORDER — XOLAIR 300 MG/2ML ~~LOC~~ SOAJ
300.0000 mg | SUBCUTANEOUS | 11 refills | Status: AC
  Filled 2023-12-11 – 2023-12-12 (×2): qty 2, 28d supply, fill #0
  Filled 2024-01-27: qty 2, 28d supply, fill #1
  Filled 2024-04-03: qty 2, 28d supply, fill #2
  Filled 2024-04-24: qty 2, 28d supply, fill #3
  Filled 2024-06-01: qty 2, 28d supply, fill #4
  Filled 2024-06-23: qty 2, 28d supply, fill #5
  Filled 2024-08-05: qty 2, 28d supply, fill #6

## 2023-12-11 NOTE — Addendum Note (Signed)
 Addended by: Evangelina Hilt on: 12/11/2023 09:25 AM   Modules accepted: Orders

## 2023-12-12 ENCOUNTER — Other Ambulatory Visit: Payer: Self-pay

## 2023-12-12 ENCOUNTER — Ambulatory Visit: Admitting: Family Medicine

## 2023-12-12 ENCOUNTER — Ambulatory Visit

## 2023-12-12 ENCOUNTER — Encounter: Payer: Self-pay | Admitting: Family Medicine

## 2023-12-12 VITALS — BP 110/60 | HR 80 | Temp 97.1°F | Resp 18 | Ht 65.35 in | Wt 152.6 lb

## 2023-12-12 DIAGNOSIS — B999 Unspecified infectious disease: Secondary | ICD-10-CM

## 2023-12-12 DIAGNOSIS — J4550 Severe persistent asthma, uncomplicated: Secondary | ICD-10-CM | POA: Diagnosis not present

## 2023-12-12 DIAGNOSIS — L501 Idiopathic urticaria: Secondary | ICD-10-CM | POA: Diagnosis not present

## 2023-12-12 DIAGNOSIS — H1013 Acute atopic conjunctivitis, bilateral: Secondary | ICD-10-CM | POA: Diagnosis not present

## 2023-12-12 DIAGNOSIS — J3089 Other allergic rhinitis: Secondary | ICD-10-CM | POA: Diagnosis not present

## 2023-12-12 DIAGNOSIS — R040 Epistaxis: Secondary | ICD-10-CM

## 2023-12-12 DIAGNOSIS — H1010 Acute atopic conjunctivitis, unspecified eye: Secondary | ICD-10-CM

## 2023-12-12 DIAGNOSIS — J302 Other seasonal allergic rhinitis: Secondary | ICD-10-CM

## 2023-12-12 MED ORDER — OMALIZUMAB 300 MG/2  ML ~~LOC~~ SOSY
300.0000 mg | PREFILLED_SYRINGE | SUBCUTANEOUS | Status: AC
  Administered 2023-12-12 – 2024-07-17 (×7): 300 mg via SUBCUTANEOUS

## 2023-12-12 NOTE — Progress Notes (Signed)
 Immunotherapy   Patient Details  Name: Claudia Winters MRN: [id number] Date of Birth: 10-20-1990  12/12/2023  Claudia Winters started injections for Hives. Patient received 300 mg Xolair  and waited 1 hour with no problems.    Frequency:every 28 days Epi-Pen:Epi-Pen Available  Consent signed and patient instructions given.   Denton Flakes 12/12/2023, 10:06 AM

## 2023-12-12 NOTE — Progress Notes (Signed)
 Specialty Pharmacy Refill Coordination Note  Claudia Winters is a 33 y.o. female assessed today regarding refills of clinic administered specialty medication(s) Omalizumab  (XOLAIR )   Clinic requested Courier to Provider Office   Delivery date: 01/07/24   Verified address: [address] [place] [address]   Medication will be filled on 7.07.25.

## 2024-01-06 ENCOUNTER — Other Ambulatory Visit: Payer: Self-pay

## 2024-01-09 ENCOUNTER — Ambulatory Visit

## 2024-01-09 DIAGNOSIS — L501 Idiopathic urticaria: Secondary | ICD-10-CM | POA: Diagnosis not present

## 2024-01-12 ENCOUNTER — Encounter: Payer: Self-pay | Admitting: Family Medicine

## 2024-01-13 MED ORDER — CROMOLYN SODIUM 4 % OP SOLN
OPHTHALMIC | 3 refills | Status: AC
Start: 2024-01-13 — End: ?

## 2024-01-14 ENCOUNTER — Encounter: Payer: Self-pay | Admitting: Neurology

## 2024-01-14 ENCOUNTER — Ambulatory Visit: Admitting: Neurology

## 2024-01-14 VITALS — BP 104/67 | HR 73 | Ht 65.0 in | Wt 151.0 lb

## 2024-01-14 DIAGNOSIS — R519 Headache, unspecified: Secondary | ICD-10-CM | POA: Diagnosis not present

## 2024-01-14 DIAGNOSIS — Z789 Other specified health status: Secondary | ICD-10-CM

## 2024-01-14 DIAGNOSIS — G479 Sleep disorder, unspecified: Secondary | ICD-10-CM | POA: Diagnosis not present

## 2024-01-14 DIAGNOSIS — G43909 Migraine, unspecified, not intractable, without status migrainosus: Secondary | ICD-10-CM

## 2024-01-14 DIAGNOSIS — G4489 Other headache syndrome: Secondary | ICD-10-CM

## 2024-01-14 DIAGNOSIS — F17200 Nicotine dependence, unspecified, uncomplicated: Secondary | ICD-10-CM

## 2024-01-14 DIAGNOSIS — Z72820 Sleep deprivation: Secondary | ICD-10-CM

## 2024-01-14 NOTE — Patient Instructions (Addendum)
 It was nice to meet you today.   As discussed, your headaches are likely due to a combination of factors.   Here is what we discussed today and my recommendations for you:   Please remember, common headache triggers are: sleep deprivation, dehydration, overheating, stress, hypoglycemia or skipping meals and blood sugar fluctuations, excessive pain medications or excessive alcohol use or caffeine withdrawal. Some people have food triggers such as aged cheese, orange juice or chocolate, especially dark chocolate, or MSG (monosodium glutamate). Try to avoid these headache triggers as much possible. It may be helpful to keep a headache diary to figure out what makes your headaches worse or brings them on and what alleviates them. Some people report headache onset after exercise but studies have shown that regular exercise may actually prevent headaches from coming. If you have exercise-induced headaches, please make sure that you drink plenty of fluid before and after exercising and that you do not over do it and do not overheat. Please avoid taking daily over-the-counter pain medication such as Tylenol , Advil or Motrin as these medications can perpetuate headaches.  Reduce and limit your caffeine to 1-2 servings/day (i.e. up to 16 ounces), as caffeine can drive headaches.  We will do a brain scan, called MRI and call you with the test results. We will have to schedule you for this on a separate date. This test requires authorization from your insurance, and we will take care of the insurance process. I will order a home sleep test to look for signs of obstructive sleep apnea (aka OSA). As explained, the long-term risks and ramifications of untreated moderate to severe obstructive sleep apnea may include (but are not limited to): increased risk for cardiovascular disease, including congestive heart failure, stroke, difficult to control hypertension, treatment resistant obesity, arrhythmias, especially  irregular heartbeat commonly known as A. Fib. (atrial fibrillation); even type 2 diabetes has been linked to untreated OSA.  For headache management I recommend that you continue with your current medication regimen through your primary care.   Please increase your water intake to 64 ounces per day.   Try to make enough time for sleep, 7-1/2 to 8-1/2 hours are generally recommended for the average adult.   I recommend that you work on smoking cessation, talk to your primary care about ways to quit smoking.   We will plan a follow up after testing.  We will keep you posted as to your test results by phone call in the interim.

## 2024-01-14 NOTE — Progress Notes (Signed)
 Subjective:    Patient ID: Claudia Winters is a 33 y.o. female.  HPI    True Mar, MD, PhD [facility] [address] [address] [place], [place] [address]  Dear Lauraine,  I saw your patient, Claudia Winters, upon your kind request, in my neurologic clinic today for initial consultation of her headaches, concern for migraines.  The patient is unaccompanied today.  As you know, Claudia Winters is a 33 year old female with an underlying medical history of ADD, allergies, asthma, vitamin D deficiency, diabetes, mood disorder including anxiety, bipolar disorder (followed by psychiatry), smoking, and insomnia, who reports recurrent headaches for years, worse over the past year and a half.  She reports that headaches are worse when she is on her psychotropic medications and better when she is off of the medications, unclear which medications are correlating with her headaches.  She is currently no longer on Seroquel, no longer on Zomig, no longer on bupropion, buspirone, Vraylar , or Vistaril .  She does take Adderall XR which is 30 mg strength, and clonazepam 0.5 mg strength 4 times a day as needed.  She is no longer on nortriptyline, as per psychiatry recommendation.  I reviewed your office note from 09/25/2023.  She was started on topiramate and zolmitriptan as well as Imitrex nasal spray at the time.   She has not had any brain imaging. She does not sleep well.  She estimates that she does not get more than 3 to 4 hours of sleep on any given night.  She goes to bed around 1 AM and rise time is between 5 and 6:30 AM.  She does not currently drink any alcohol.  She smokes somewhere between half a pack or a third of a pack per day.  She is currently not planning to quit.  She drinks caffeine in the form of sweet tea, 1 large cup from [employer], she estimates that it could be 32 ounces.  She estimates that she drinks about a liter of water per day. She is up-to-date with her eye  examination, she was seen in November or December for her eyes and had new glasses made in January 2025, prescription remained the same. She reports a headache that is in the frontal areas, she feels a pressure-like sensation, she does have nausea and takes Zofran  as needed.  She has some light sensitivity with her headaches.  Duration can be variable, up to 2 to 3 days.  Frequency about once a week.  She snores occasionally.  Her Epworth sleepiness score is 1 out of 24, fatigue severity score is 14 out of 63.  She has woken up with a headache.  She has never had a sleep study.   Her Past Medical History Is Significant For: Past Medical History:  Diagnosis Date   ADHD    Allergies    seasonal   Anxiety    Asthma    Bipolar 1 disorder (HCC)    Chronic urticaria 05/28/2022   Diabetes 1.5, managed as type 2 (HCC) 12/2020   Insomnia     Her Past Surgical History Is Significant For: Past Surgical History:  Procedure Laterality Date   NO PAST SURGERIES      Her Family History Is Significant For: Family History  Problem Relation Age of Onset   Migraines Mother    Allergic rhinitis Neg Hx    Angioedema Neg Hx    Asthma Neg Hx    Atopy Neg Hx    Eczema Neg  Hx    Immunodeficiency Neg Hx    Urticaria Neg Hx     Her Social History Is Significant For: Social History   Socioeconomic History   Marital status: Single    Spouse name: Not on file   Number of children: Not on file   Years of education: Not on file   Highest education level: Not on file  Occupational History   Not on file  Tobacco Use   Smoking status: Some Days    Current packs/day: 2.00    Average packs/day: 2.0 packs/day for 5.7 years (11.4 ttl pk-yrs)    Types: Cigarettes    Start date: 05/01/2018    Passive exposure: Never   Smokeless tobacco: Never  Vaping Use   Vaping status: Some Days  Substance and Sexual Activity   Alcohol use: Not Currently    Alcohol/week: 5.0 standard drinks of alcohol    Types:  5 Shots of liquor per week    Comment: social drinker, none in 1.5 years   Drug use: No   Sexual activity: Not on file  Other Topics Concern   Not on file  Social History Narrative   Right handed   Caffeine: large tea 3-4 days/week   Lives with family    Social Drivers of Health   Financial Resource Strain: Patient Declined (11/05/2023)   Received from [facility]   Overall Financial Resource Strain (CARDIA)    Difficulty of Paying Living Expenses: Patient declined  Food Insecurity: Food Insecurity Present (11/05/2023)   Received from [facility]   Hunger Vital Sign    Within the past 12 months, you worried that your food would run out before you got the money to buy more.: Sometimes true    Within the past 12 months, the food you bought just didn't last and you didn't have money to get more.: Patient declined  Transportation Needs: No Transportation Needs (11/05/2023)   Received from [facility] - Transportation    Lack of Transportation (Medical): No    Lack of Transportation (Non-Medical): No  Physical Activity: Unknown (11/05/2023)   Received from [facility]   Exercise Vital Sign    On average, how many days per week do you engage in moderate to strenuous exercise (like a brisk walk)?: 0 days    Minutes of Exercise per Session: Not on file  Stress: Stress Concern Present (11/05/2023)   Received from [facility] - Occupational Stress Questionnaire    Feeling of Stress : Rather much  Social Connections: Patient Declined (11/05/2023)   Received from [facility]   Social Network    How would you rate your social network (family, work, friends)?: Patient declined    Her Allergies Are:  Allergies  Allergen Reactions   Corn-Containing Products Swelling and Other (See Comments)    Face goes numb   Lavender Oil Other (See Comments)    Throat swelling  :   Her Current Medications Are:  Outpatient Encounter Medications  as of 01/14/2024  Medication Sig   albuterol  (PROVENTIL ) (2.5 MG/3ML) 0.083% nebulizer solution Take 3 mLs (2.5 mg total) by nebulization every 4 (four) hours as needed for wheezing or shortness of breath.   albuterol  (VENTOLIN  HFA) 108 (90 Base) MCG/ACT inhaler Inhale 1-2 puffs into the lungs every 6 (six) hours as needed for wheezing or shortness of breath.   amphetamine-dextroamphetamine (ADDERALL XR) 30 MG 24 hr capsule Take 30 mg by mouth every  morning.   clonazePAM (KLONOPIN) 0.5 MG tablet Take 0.5 mg by mouth 4 (four) times daily as needed.   EPINEPHrine  0.3 mg/0.3 mL IJ SOAJ injection Use as directed for severe allergic reactions   famotidine  (PEPCID ) 20 MG tablet Take 1 tablet (20 mg total) by mouth 2 (two) times daily as needed (hives).   FANAPT 10 MG TABS Take 1 tablet by mouth 2 (two) times daily.   fexofenadine  (ALLEGRA ) 180 MG tablet Take 1 tablet (180 mg total) by mouth 2 (two) times daily as needed (hives).   Fluticasone-Umeclidin-Vilant (TRELEGY ELLIPTA ) 200-62.5-25 MCG/ACT AEPB Inhale 1 puff into the lungs daily.   levonorgestrel  (MIRENA) 20 MCG/DAY IUD 1 each by Intrauterine route once.   montelukast  (SINGULAIR ) 10 MG tablet Take 1 tablet (10 mg total) by mouth at bedtime.   omalizumab  (XOLAIR ) 300 MG/2ML injection Inject 300 mg into the skin every 28 (twenty-eight) days.   ondansetron  (ZOFRAN ) 4 MG tablet Take 1 tablet by mouth every 8 (eight) hours as needed.   OZEMPIC, 0.25 OR 0.5 MG/DOSE, 2 MG/1.5ML SOPN Inject 0.5 mg into the skin once a week.   cromolyn  (OPTICROM ) 4 % ophthalmic solution 1-2 drops in each eye up to 4 times a day if needed for red or itchy eyes (Patient not taking: Reported on 01/14/2024)   [DISCONTINUED] amphetamine-dextroamphetamine (ADDERALL XR) 20 MG 24 hr capsule Take 2 capsules by mouth in the morning. (Patient not taking: Reported on 05/28/2022)   [DISCONTINUED] buPROPion (WELLBUTRIN XL) 300 MG 24 hr tablet Take 300 mg by mouth every morning.    [DISCONTINUED] busPIRone (BUSPAR) 30 MG tablet Take 30 mg by mouth 2 (two) times daily.   [DISCONTINUED] cariprazine  (VRAYLAR ) capsule Take 1 capsule (1.5 mg total) by mouth daily. (Patient not taking: Reported on 05/28/2022)   [DISCONTINUED] clonazePAM (KLONOPIN) 1 MG tablet Take 1 mg by mouth 3 (three) times daily as needed.   [DISCONTINUED] cyanocobalamin  (VITAMIN B12) 1000 MCG tablet Take 1,000 mcg by mouth daily.   [DISCONTINUED] hydrOXYzine  (VISTARIL ) 50 MG capsule Take 50 mg by mouth every 4 (four) hours as needed.   [DISCONTINUED] Iloperidone (FANAPT) 8 MG TABS Take 8 mg by mouth in the morning and at bedtime.   [DISCONTINUED] levonorgestrel  (KYLEENA ) 19.5 MG IUD Kyleena  17.5 mcg/24 hour (5 years) intrauterine device  Take 1 device by intrauterine route.   [DISCONTINUED] nicotine polacrilex (NICORETTE) 2 MG gum SMARTSIG:1 Gum By Mouth   [DISCONTINUED] nortriptyline (PAMELOR) 10 MG capsule Take 10 mg by mouth daily.   [DISCONTINUED] QUEtiapine (SEROQUEL) 100 MG tablet Take 200 mg by mouth at bedtime.   [DISCONTINUED] Vitamin D, Ergocalciferol, (DRISDOL) 1.25 MG (50000 UNIT) CAPS capsule Take 50,000 Units by mouth every 7 (seven) days.   [DISCONTINUED] zolmitriptan (ZOMIG) 5 MG tablet Take 5 mg by mouth as needed.   Facility-Administered Encounter Medications as of 01/14/2024  Medication   omalizumab  (XOLAIR ) injection 300 mg   omalizumab  (XOLAIR ) prefilled syringe 300 mg  :   Review of Systems:  Out of a complete 14 point review of systems, all are reviewed and negative with the exception of these symptoms as listed below:   Review of Systems  Neurological:        Patient is here alone for migraine referral. She reports she has had them on and off for a couple of years but they have been more consistent the last 1.5 years. She gets them at least once a week and they last a couple of days.  She starts  losing sleep with a migraine at day 2. Her psychiatrist prescribed her medication to  help her sleep. She has rare days where she is headache free. She is taking Topiramate 100 mg BID, Sumatriptan 20 mg nasal spray. Previously she tried Zomig, Nortriptyline multiple doses, Rizatriptan, steroid injection and another injection she cannot recall the name of. Once she gets a shot her migraine stops within a couple of days but a week later they will come back. Yesterday she left work early due to a bad migraine. ESS 1 FSS 14.    Objective:  Neurological Exam  Physical Exam Physical Examination:   Vitals:   01/14/24 0816  BP: 104/67  Pulse: 73    General Examination: The patient is a very pleasant 33 y.o. female in no acute distress. She appears well-developed and well-nourished and well groomed.   HEENT: Normocephalic, atraumatic, pupils are equal, round and reactive to light, corrective eyeglasses in place.  Funduscopic exam benign.  Extraocular tracking is good without limitation to gaze excursion or nystagmus noted. Hearing is grossly intact. Face is symmetric with normal facial animation and normal facial sensation to light touch, temperature and vibration sense. Speech is clear with no dysarthria noted. There is no hypophonia. There is no lip, neck/head, jaw or voice tremor. Neck is supple with full range of passive and active motion. There are no carotid bruits on auscultation.   Chest: Clear to auscultation without wheezing, rhonchi or crackles noted.  Heart: S1+S2+0, regular and normal without murmurs, rubs or gallops noted.   Abdomen: Soft, non-tender and non-distended.  Extremities: There is no pitting edema in the distal lower extremities bilaterally.   Skin: Warm and dry without trophic changes noted.   Musculoskeletal: exam reveals no obvious joint deformities.   Neurologically:  Mental status: The patient is awake, alert and oriented in all 4 spheres. Her immediate and remote memory, attention, language skills and fund of knowledge are appropriate. There is no  evidence of aphasia, agnosia, apraxia or anomia. Speech is clear with normal prosody and enunciation. Thought process is linear. Mood is constricted and affect is blunted.  Cranial nerves II - XII are as described above under HEENT exam.  Motor exam: Normal bulk, strength and tone is noted. There is no obvious action or resting tremor.  No postural or intention tremor.  No drift or rebound. Fine motor skills and coordination: intact finger taps, hand movements and rapid alternating patting with both upper extremities, normal foot taps bilaterally in the lower extremities.  Romberg negative.  Reflexes 1+ throughout, toes are downgoing bilaterally. Cerebellar testing: No dysmetria or intention tremor. There is no truncal or gait ataxia.  Normal finger-to-nose, normal heel-to-shin bilaterally. Sensory exam: intact to light touch, temperature and vibration sense in the upper and lower extremities.  Gait, station and balance: She stands easily. No veering to one side is noted. No leaning to one side is noted. Posture is age-appropriate and stance is narrow based. Gait shows normal stride length and normal pace. No problems turning are noted.  Normal tandem walk.  Assessment and Plan:    In summary, Claudia Winters is a 33 year old female with an underlying medical history of ADD, allergies, asthma, vitamin D deficiency, diabetes, mood disorder including anxiety, bipolar disorder (followed by psychiatry), smoking, and insomnia, who presents for evaluation of her recurrent headaches of several years duration.  Neurological exam is nonfocal, nevertheless, I would like to proceed with a brain MRI with and without contrast to rule out a structural  cause of her symptoms, migraine headaches are 1 possibility, more likely, she has a mixed type of headache including caffeine overuse headache, sleep deprivation related headaches, sleep disordered breathing not completely excluded, headaches from suboptimal hydration and  medication effect including the Adderall.   I had a long discussion with the patient today.  She is encouraged to stop smoking.  She is encouraged to work on lifestyle modifications and continue with her current medication regimen for now.    Below is a summary of my recommendations and our discussion points based on today's visit.  She was given these instructions verbally during the visit and also in her after visit summary in MyChart which she can access electronically as she confirmed.   << Please remember, common headache triggers are: sleep deprivation, dehydration, overheating, stress, hypoglycemia or skipping meals and blood sugar fluctuations, excessive pain medications or excessive alcohol use or caffeine withdrawal. Some people have food triggers such as aged cheese, orange juice or chocolate, especially dark chocolate, or MSG (monosodium glutamate). Try to avoid these headache triggers as much possible. It may be helpful to keep a headache diary to figure out what makes your headaches worse or brings them on and what alleviates them. Some people report headache onset after exercise but studies have shown that regular exercise may actually prevent headaches from coming. If you have exercise-induced headaches, please make sure that you drink plenty of fluid before and after exercising and that you do not over do it and do not overheat. Please avoid taking daily over-the-counter pain medication such as Tylenol , Advil or Motrin as these medications can perpetuate headaches.  Reduce and limit your caffeine to 1-2 servings/day (i.e. up to 16 ounces), as caffeine can drive headaches.  We will do a brain scan, called MRI and call you with the test results. We will have to schedule you for this on a separate date. This test requires authorization from your insurance, and we will take care of the insurance process. I will order a home sleep test to look for signs of obstructive sleep apnea (aka OSA). As  explained, the long-term risks and ramifications of untreated moderate to severe obstructive sleep apnea may include (but are not limited to): increased risk for cardiovascular disease, including congestive heart failure, stroke, difficult to control hypertension, treatment resistant obesity, arrhythmias, especially irregular heartbeat commonly known as A. Fib. (atrial fibrillation); even type 2 diabetes has been linked to untreated OSA.  For headache management I recommend that you continue with your current medication regimen through your primary care.   Please increase your water intake to 64 ounces per day.   Try to make enough time for sleep, 7-1/2 to 8-1/2 hours are generally recommended for the average adult.   I recommend that you work on smoking cessation, talk to your primary care about ways to quit smoking.   We will plan a follow up after testing.  We will keep you posted as to your test results by phone call in the interim.  >>    Thank you very much for allowing me to participate in the care of this nice patient. If I can be of any further assistance to you please do not hesitate to call me at [phone].  Sincerely,   True Mar, MD, PhD

## 2024-01-24 ENCOUNTER — Ambulatory Visit (INDEPENDENT_AMBULATORY_CARE_PROVIDER_SITE_OTHER): Admitting: Neurology

## 2024-01-24 ENCOUNTER — Other Ambulatory Visit: Payer: Self-pay

## 2024-01-24 DIAGNOSIS — G479 Sleep disorder, unspecified: Secondary | ICD-10-CM

## 2024-01-24 DIAGNOSIS — G4733 Obstructive sleep apnea (adult) (pediatric): Secondary | ICD-10-CM

## 2024-01-24 DIAGNOSIS — R519 Headache, unspecified: Secondary | ICD-10-CM

## 2024-01-24 DIAGNOSIS — F17200 Nicotine dependence, unspecified, uncomplicated: Secondary | ICD-10-CM

## 2024-01-24 DIAGNOSIS — G4489 Other headache syndrome: Secondary | ICD-10-CM

## 2024-01-24 DIAGNOSIS — G43909 Migraine, unspecified, not intractable, without status migrainosus: Secondary | ICD-10-CM

## 2024-01-24 DIAGNOSIS — R0683 Snoring: Secondary | ICD-10-CM

## 2024-01-24 DIAGNOSIS — Z72820 Sleep deprivation: Secondary | ICD-10-CM

## 2024-01-24 DIAGNOSIS — Z789 Other specified health status: Secondary | ICD-10-CM

## 2024-01-27 ENCOUNTER — Other Ambulatory Visit: Payer: Self-pay

## 2024-01-27 NOTE — Progress Notes (Signed)
 Specialty Pharmacy Refill Coordination Note  Claudia Winters is a 33 y.o. female assessed today regarding refills of clinic administered specialty medication(s) Omalizumab  (XOLAIR )   Clinic requested Courier to Provider Office   Delivery date: 01/30/24   Injection date: 02/07/24  Verified address: [address] [place] [address]   Medication will be filled on 01/29/24.

## 2024-01-28 ENCOUNTER — Ambulatory Visit

## 2024-01-28 ENCOUNTER — Other Ambulatory Visit: Payer: Self-pay

## 2024-01-28 DIAGNOSIS — R519 Headache, unspecified: Secondary | ICD-10-CM

## 2024-01-28 DIAGNOSIS — Z72820 Sleep deprivation: Secondary | ICD-10-CM

## 2024-01-28 DIAGNOSIS — G43909 Migraine, unspecified, not intractable, without status migrainosus: Secondary | ICD-10-CM

## 2024-01-28 DIAGNOSIS — G479 Sleep disorder, unspecified: Secondary | ICD-10-CM

## 2024-01-28 DIAGNOSIS — G4489 Other headache syndrome: Secondary | ICD-10-CM

## 2024-01-28 DIAGNOSIS — Z789 Other specified health status: Secondary | ICD-10-CM | POA: Diagnosis not present

## 2024-01-28 DIAGNOSIS — F17200 Nicotine dependence, unspecified, uncomplicated: Secondary | ICD-10-CM

## 2024-01-28 MED ORDER — GADOBENATE DIMEGLUMINE 529 MG/ML IV SOLN
15.0000 mL | Freq: Once | INTRAVENOUS | Status: AC | PRN
  Administered 2024-01-28: 15 mL via INTRAVENOUS

## 2024-02-03 ENCOUNTER — Ambulatory Visit: Payer: Self-pay | Admitting: Neurology

## 2024-02-07 ENCOUNTER — Ambulatory Visit

## 2024-02-11 ENCOUNTER — Encounter (INDEPENDENT_AMBULATORY_CARE_PROVIDER_SITE_OTHER): Payer: Self-pay

## 2024-02-12 NOTE — Progress Notes (Signed)
 See procedure note.

## 2024-02-14 NOTE — Procedures (Signed)
 [facility]  HOME SLEEP TEST (SANSA) REPORT (Mail-Out Device):   STUDY DATE: 02/02/2024  DOB: Nov 06, 1990  MRN: [id number]  ORDERING CLINICIAN: True Mar, MD, PhD   REFERRING CLINICIAN: Lauraine Burkes, PA  CLINICAL INFORMATION/HISTORY: 33 year old female with an underlying medical history of ADD, allergies, asthma, vitamin D deficiency, diabetes, mood disorder including anxiety, bipolar disorder (followed by psychiatry), smoking, and insomnia, who reports recurrent headaches.  She does not sleep well. She snores occasionally.  She has woken up with a headache.  She has never had a sleep study.   PATIENT'S LAST REPORTED EPWORTH SLEEPINESS SCORE (ESS): 1/24.  BMI (at the time of sleep clinic visit and/or test date): 25.1 kg/m  FINDINGS:   Study Protocol:    The SANSA single-point-of-skin-contact chest-worn sensor - an FDA cleared and DOT approved type 4 home sleep test device - measures eight physiological channels,  including blood oxygen saturation (measured via PPG [photoplethysmography]), EKG-derived heart rate, respiratory effort, chest movement (measured via accelerometer), snoring, body position, and actigraphy. The device is designed to be worn for up to 10 hours per study.   Sleep Summary:   Total Recording Time (hours, min): 9 hours, 53 min  Total Effective Sleep Time (hours, min):  7 hours, 47 min  Sleep Efficiency (%):    79%   Respiratory Indices:   Calculated sAHI (per hour):  4.6/hour         Oxygen Saturation Statistics:    Oxygen Saturation (%) Mean: 96.6%   Minimum oxygen saturation (%):                 78.5%   O2 Saturation Range (%): 78.5-99.9%   Time below or at 88% saturation: 1 min   Pulse Rate Statistics:   Pulse Mean (bpm):    74/min    Pulse Range (56-123/min)   Snoring: Intermittent, mild to moderate  IMPRESSION/DIAGNOSES:   Primary snoring  RECOMMENDATIONS:   This home sleep test does not demonstrate any  significant obstructive sleep disordered breathing with a total AHI of less than 5/hour. Her total AHI was 4.6/hour, O2 nadir of 78.5% without any significant time below 89% saturation for the night (less than 2 min).  Intermittent mild to moderate snoring was detected. Treatment with a positive airway pressure device such as AutoPap or CPAP is not indicated based on this test. Snoring may improve with avoidance of the supine sleep position and weight loss (where clinically appropriate).   For disturbing snoring, an oral appliance through dentistry or orthodontics can be considered.  Other causes of the patient's symptoms, including circadian rhythm disturbances, an underlying mood disorder, medication effect and/or an underlying medical problem cannot be ruled out based on this test. Clinical correlation is recommended.  The patient should be cautioned not to drive, work at heights, or operate dangerous or heavy equipment when tired or sleepy. Review and reiteration of good sleep hygiene measures should be pursued with any patient. The patient will be advised to follow up with her referring provider, who will be notified of the test results.   I certify that I have reviewed the raw data recording prior to the issuance of this report in accordance with the standards of the American Academy of Sleep Medicine (AASM).    INTERPRETING PHYSICIAN:   True Mar, MD, PhD Medical Director, [facility]) Diplomat, ABPN (Neurology and Sleep)   [facility] [address], [address] [place], [place] [address] [phone]

## 2024-02-19 ENCOUNTER — Other Ambulatory Visit: Payer: Self-pay

## 2024-02-26 ENCOUNTER — Other Ambulatory Visit: Payer: Self-pay

## 2024-03-16 ENCOUNTER — Ambulatory Visit (INDEPENDENT_AMBULATORY_CARE_PROVIDER_SITE_OTHER)

## 2024-03-16 DIAGNOSIS — L501 Idiopathic urticaria: Secondary | ICD-10-CM

## 2024-03-18 ENCOUNTER — Other Ambulatory Visit (HOSPITAL_COMMUNITY): Payer: Self-pay

## 2024-04-03 ENCOUNTER — Other Ambulatory Visit: Payer: Self-pay

## 2024-04-03 ENCOUNTER — Other Ambulatory Visit: Payer: Self-pay | Admitting: Pharmacy Technician

## 2024-04-03 NOTE — Progress Notes (Signed)
 Specialty Pharmacy Refill Coordination Note  Claudia Winters is a 33 y.o. female assessed today regarding refills of clinic administered specialty medication(s) Omalizumab  (XOLAIR )   Clinic requested Courier to Provider Office   Delivery date: 04/06/24   Verified address: [facility] [address]   Medication will be filled on 04/03/24.  Injection Appointment 04/13/24.  Copay $0.

## 2024-04-10 NOTE — Patient Instructions (Addendum)
 Asthma Continue montelukast  to 10 mg once a day to prevent cough or wheeze Restart Trelegy 200-1 puff once a day to prevent cough or wheeze Continue albuterol  2 puffs every 4 hours as needed for cough or wheeze OR Instead use albuterol  0.083% solution via nebulizer one unit vial every 4 hours as needed for cough or wheeze You may use albuterol  2 puffs 5 to 15 minutes before activity to decrease cough or wheeze  Allergic rhinitis Continue allergen avoidance measures directed toward tree pollen, weed pollen, mold, dust mite, and dog as listed below Continue an antihistamine once a day as needed for runny nose or itch. Remember to rotate to a different antihistamine about every 3 months. Some examples of over the counter antihistamines include Zyrtec (cetirizine), Xyzal (levocetirizine), Allegra  (fexofenadine ), and Claritin (loratidine).  Continue Flonase 2 sprays in each nostril once a day as needed for stuffy nose  Continue azelastine 1 spray in each nostril twice a day as needed for nasal symptoms Consider saline nasal rinses as needed for nasal symptoms. Use this before any medicated nasal sprays for best result Consider nasal saline gel as needed for dry nostrils Consider allergen immunotherapy if your symptoms are not well controlled with the treatment plan as listed below  Allergic conjunctivitis Begin cromolyn  eye drops 1-2 drops in each eye up to 4 times a day if needed for red or itchy eyes  Chronic urticaria Take the least amount of medications while remaining hive free Allegra  180 mg twice a day and famotidine  (Pepcid ) 20 mg twice a day. If no symptoms for 7-14 days then decrease to. Allegra  180 mg twice a day and famotidine  (Pepcid ) 20 mg once a day.  If no symptoms for 7-14 days then decrease to. Allegra  180 mg twice a day.  If no symptoms for 7-14 days then decrease to. Allegra  180 mg once a day.   Continue Xolair  injections 300 mg once every 28 days and have access to an  epinephrine  autoinjector set per protocol  Epistaxis Pinch both nostrils while leaning forward for at least 5 minutes before checking to see if the bleeding has stopped. If bleeding is not controlled within 5-10 minutes apply a cotton ball soaked with oxymetazoline (Afrin) to the bleeding nostril for a few seconds.  If the problem persists or worsens a referral to ENT for further evaluation may be necessary.  Recurrent infections Continue to keep track of infections, antibiotics, and steroid use Consider an ENT referral for evaluation of frequent sinus infections requiring antibiotics and steroids  Call the clinic if this treatment plan is not working well for you.  Follow up in the clinic in 3 or months or sooner if needed.

## 2024-04-10 NOTE — Progress Notes (Signed)
 [address] [place] [address] Dept: [phone]  FOLLOW UP NOTE  Patient ID: Claudia Winters, female    DOB: Apr 14, 1991  Age: 33 y.o. MRN: [id number] Date of Office Visit: 04/13/2024  Assessment  Chief Complaint: Allergic Rhinitis  (Sinus headaches and teeth pain. Pain and pressure) and Asthma  HPI Claudia Winters is a 33 year old female who presents to clinic for a follow-up visit.  She was last seen in this clinic on 12/12/2023 by Arlean Mutter, FNP, for evaluation of asthma, allergic rhinitis, allergic conjunctivitis, urticaria, epistaxis, and recurrent infection with normal workup.  Discussed the use of AI scribe software for clinical note transcription with the patient, who gave verbal consent to proceed.  History of Present Illness Claudia Winters is a 33 year old female with asthma and food allergies who presents with worsening allergy symptoms.  She reports food allergies, particularly to foods containing preservatives, which cause facial tightness and throat swelling, making swallowing difficult. She manages these symptoms by avoiding non-organic and non-vegan foods and has a known gluten allergy.  She reports that she experiences coughing fits and shortness of breath when driving in certain areas in [place] and when exposed to perfumes at work. Her current medications include montelukast  10 mg once a day, Trelegy 200-1 puff once a day, and albuterol  as needed, with albuterol  used once or twice a week with relief of symptoms.  She reports asthma symptoms are especially aggravating during 'beauty bash' events at work where perfumes are prevalent.  She experiences hives and itching, particularly after being outside late at night, and takes Allegra  twice a day, famotidine  twice a day, and Xolair  injections.  She denies concomitant cardiopulmonary or gastrointestinal symptoms with these hives. She experiences allergy symptoms such as runny and stuffy nose, sneezing, and post-nasal drainage.  She uses Flonase and has previously used Astelin for sinus issues. She has not used a saline rinse. Her last environmental allergy testing via lab was on 01/27/2021 and was positive to pollen, mold, dust mite, and dog.  She suffers from sinusitis and migraines, with migraines occurring once or twice a week and lasting up to three to four days. These migraines sometimes lead to sinus headaches with pressure in the front of her head and sensitivity in her teeth. She has been treated with steroids for migraine relief and had an MRI which showed no abnormalities.  She continues to follow-up with neurology as recommended.  She has not had any infections requiring antibiotics since her last visit to this clinic. She has previously had an immune screening with our clinic on 07/09/2019 which indicated normal immunoglobulin levels and protective titers to diphtheria, tetanus, and 21 out of 23 protective strep pneumoniae titers.   She denies episodes of epistaxis since her last visit to this clinic.  She reports dry eyes that become painful, for which she uses Refresh gel drops. She has not worn contacts for about a year due to dryness issues.  Her current medications are listed in the chart.     Drug Allergies:  Allergies  Allergen Reactions   Corn-Containing Products Swelling and Other (See Comments)    Face goes numb   Lavender Oil Other (See Comments)    Throat swelling    Physical Exam: BP 112/70 (BP Location: Right Arm, Patient Position: Sitting, Cuff Size: Normal)   Pulse 80   Temp 98.3 F (36.8 C) (Temporal)   Ht 5' 5.5 (1.664 m)   Wt 156 lb 8 oz (71 kg)  SpO2 99%   BMI 25.65 kg/m    Physical Exam Vitals reviewed.  Constitutional:      Appearance: Normal appearance.  HENT:     Head: Normocephalic and atraumatic.     Right Ear: Tympanic membrane normal.     Left Ear: Tympanic membrane normal.     Nose:     Comments: Bilateral ears slightly erythematous with thin clear nasal  drainage noted.  Pharynx normal.  Ears normal.  Eyes normal.    Mouth/Throat:     Pharynx: Oropharynx is clear.  Eyes:     Conjunctiva/sclera: Conjunctivae normal.  Cardiovascular:     Rate and Rhythm: Normal rate and regular rhythm.     Heart sounds: Normal heart sounds. No murmur heard. Pulmonary:     Effort: Pulmonary effort is normal.     Breath sounds: Normal breath sounds.     Comments: Lungs clear to auscultation Musculoskeletal:        General: Normal range of motion.     Cervical back: Normal range of motion and neck supple.  Skin:    General: Skin is warm and dry.  Neurological:     Mental Status: She is alert and oriented to person, place, and time.  Psychiatric:        Mood and Affect: Mood normal.        Behavior: Behavior normal.        Thought Content: Thought content normal.        Judgment: Judgment normal.     Diagnostics: FVC 3.21 which is 96% of predicted value, FEV1 1.76 which is 62% of predicted value spirometry indicates moderate airway obstruction.  Postbronchodilator with no significant improvement in FEV1  Assessment and Plan: 1. Poorly controlled severe persistent asthma without complication (HCC)   2. Seasonal and perennial allergic rhinitis   3. Seasonal allergic conjunctivitis   4. Idiopathic urticaria   5. Epistaxis   6. Recurrent infections     Meds ordered this encounter  Medications   albuterol  (VENTOLIN  HFA) 108 (90 Base) MCG/ACT inhaler    Sig: Inhale 1-2 puffs into the lungs every 6 (six) hours as needed for wheezing or shortness of breath.    Dispense:  1 each    Refill:  1   EPINEPHrine  0.3 mg/0.3 mL IJ SOAJ injection    Sig: Use as directed for severe allergic reactions    Dispense:  2 each    Refill:  1    Please dispense Mylan or Teva brand generic only. Thank you.   Fluticasone-Umeclidin-Vilant (TRELEGY ELLIPTA ) 200-62.5-25 MCG/ACT AEPB    Sig: Inhale 1 puff into the lungs daily.    Dispense:  1 each    Refill:  5    montelukast  (SINGULAIR ) 10 MG tablet    Sig: Take 1 tablet (10 mg total) by mouth at bedtime.    Dispense:  30 tablet    Refill:  5    Patient Instructions  Asthma Continue montelukast  to 10 mg once a day to prevent cough or wheeze Restart Trelegy 200-1 puff once a day to prevent cough or wheeze Continue albuterol  2 puffs every 4 hours as needed for cough or wheeze OR Instead use albuterol  0.083% solution via nebulizer one unit vial every 4 hours as needed for cough or wheeze You may use albuterol  2 puffs 5 to 15 minutes before activity to decrease cough or wheeze  Allergic rhinitis Continue allergen avoidance measures directed toward tree pollen, weed pollen, mold, dust mite, and  dog as listed below Continue an antihistamine once a day as needed for runny nose or itch. Remember to rotate to a different antihistamine about every 3 months. Some examples of over the counter antihistamines include Zyrtec (cetirizine), Xyzal (levocetirizine), Allegra  (fexofenadine ), and Claritin (loratidine).  Continue Flonase 2 sprays in each nostril once a day as needed for stuffy nose  Continue azelastine 1 spray in each nostril twice a day as needed for nasal symptoms Consider saline nasal rinses as needed for nasal symptoms. Use this before any medicated nasal sprays for best result Consider nasal saline gel as needed for dry nostrils Consider allergen immunotherapy if your symptoms are not well controlled with the treatment plan as listed below  Allergic conjunctivitis Begin cromolyn  eye drops 1-2 drops in each eye up to 4 times a day if needed for red or itchy eyes  Chronic urticaria Take the least amount of medications while remaining hive free Allegra  180 mg twice a day and famotidine  (Pepcid ) 20 mg twice a day. If no symptoms for 7-14 days then decrease to. Allegra  180 mg twice a day and famotidine  (Pepcid ) 20 mg once a day.  If no symptoms for 7-14 days then decrease to. Allegra  180 mg twice a  day.  If no symptoms for 7-14 days then decrease to. Allegra  180 mg once a day.   Continue Xolair  injections 300 mg once every 28 days and have access to an epinephrine  autoinjector set per protocol  Epistaxis Pinch both nostrils while leaning forward for at least 5 minutes before checking to see if the bleeding has stopped. If bleeding is not controlled within 5-10 minutes apply a cotton ball soaked with oxymetazoline (Afrin) to the bleeding nostril for a few seconds.  If the problem persists or worsens a referral to ENT for further evaluation may be necessary.  Recurrent infections Continue to keep track of infections, antibiotics, and steroid use Consider an ENT referral for evaluation of frequent sinus infections requiring antibiotics and steroids  Call the clinic if this treatment plan is not working well for you.  Follow up in the clinic in 3 or months or sooner if needed.    Return in about 3 months (around 07/14/2024), or if symptoms worsen or fail to improve.    Thank you for the opportunity to care for this patient.  Please do not hesitate to contact me with questions.  Arlean Mutter, FNP [facility] 

## 2024-04-13 ENCOUNTER — Other Ambulatory Visit: Payer: Self-pay

## 2024-04-13 ENCOUNTER — Encounter: Payer: Self-pay | Admitting: Family Medicine

## 2024-04-13 ENCOUNTER — Ambulatory Visit: Admitting: Family Medicine

## 2024-04-13 ENCOUNTER — Ambulatory Visit

## 2024-04-13 VITALS — BP 112/70 | HR 80 | Temp 98.3°F | Ht 65.5 in | Wt 156.5 lb

## 2024-04-13 DIAGNOSIS — H1013 Acute atopic conjunctivitis, bilateral: Secondary | ICD-10-CM | POA: Diagnosis not present

## 2024-04-13 DIAGNOSIS — B999 Unspecified infectious disease: Secondary | ICD-10-CM

## 2024-04-13 DIAGNOSIS — J302 Other seasonal allergic rhinitis: Secondary | ICD-10-CM

## 2024-04-13 DIAGNOSIS — J4550 Severe persistent asthma, uncomplicated: Secondary | ICD-10-CM | POA: Diagnosis not present

## 2024-04-13 DIAGNOSIS — J3089 Other allergic rhinitis: Secondary | ICD-10-CM

## 2024-04-13 DIAGNOSIS — H1010 Acute atopic conjunctivitis, unspecified eye: Secondary | ICD-10-CM

## 2024-04-13 DIAGNOSIS — R040 Epistaxis: Secondary | ICD-10-CM

## 2024-04-13 DIAGNOSIS — L501 Idiopathic urticaria: Secondary | ICD-10-CM

## 2024-04-13 MED ORDER — ALBUTEROL SULFATE HFA 108 (90 BASE) MCG/ACT IN AERS: 1.0000 | INHALATION_SPRAY | Freq: Four times a day (QID) | RESPIRATORY_TRACT | 1 refills | Status: AC | PRN

## 2024-04-13 MED ORDER — TRELEGY ELLIPTA 200-62.5-25 MCG/ACT IN AEPB: 1.0000 | INHALATION_SPRAY | Freq: Every day | RESPIRATORY_TRACT | 5 refills | Status: AC

## 2024-04-13 MED ORDER — EPINEPHRINE 0.3 MG/0.3ML IJ SOAJ: INTRAMUSCULAR | 1 refills | Status: AC

## 2024-04-13 MED ORDER — MONTELUKAST SODIUM 10 MG PO TABS: 10.0000 mg | ORAL_TABLET | Freq: Every day | ORAL | 5 refills | Status: AC

## 2024-04-24 ENCOUNTER — Other Ambulatory Visit: Payer: Self-pay

## 2024-04-24 NOTE — Progress Notes (Signed)
 Specialty Pharmacy Refill Coordination Note  Claudia Winters is a 33 y.o. female assessed today regarding refills of clinic administered specialty medication(s) Omalizumab  (XOLAIR )   Clinic requested Courier to Provider Office   Delivery date: 05/05/24   Verified address: [facility] [address]   Medication will be filled on: 05/04/24

## 2024-05-03 ENCOUNTER — Other Ambulatory Visit: Payer: Self-pay | Admitting: Family Medicine

## 2024-05-04 ENCOUNTER — Other Ambulatory Visit: Payer: Self-pay

## 2024-05-11 ENCOUNTER — Ambulatory Visit

## 2024-05-14 ENCOUNTER — Ambulatory Visit

## 2024-05-14 DIAGNOSIS — L501 Idiopathic urticaria: Secondary | ICD-10-CM | POA: Diagnosis not present

## 2024-06-01 ENCOUNTER — Other Ambulatory Visit: Payer: Self-pay

## 2024-06-01 ENCOUNTER — Other Ambulatory Visit (HOSPITAL_COMMUNITY): Payer: Self-pay

## 2024-06-01 NOTE — Progress Notes (Signed)
 Specialty Pharmacy Refill Coordination Note  Claudia Winters is a 33 y.o. female assessed today regarding refills of clinic administered specialty medication(s) Omalizumab  (XOLAIR )   Clinic requested Courier to Provider Office   Delivery date: 06/04/24   Verified address: [facility] [address]   Medication will be filled on: 06/03/24

## 2024-06-02 ENCOUNTER — Other Ambulatory Visit: Payer: Self-pay

## 2024-06-03 ENCOUNTER — Other Ambulatory Visit: Payer: Self-pay

## 2024-06-11 ENCOUNTER — Ambulatory Visit (INDEPENDENT_AMBULATORY_CARE_PROVIDER_SITE_OTHER): Admitting: *Deleted

## 2024-06-11 DIAGNOSIS — L501 Idiopathic urticaria: Secondary | ICD-10-CM

## 2024-06-23 ENCOUNTER — Other Ambulatory Visit: Payer: Self-pay

## 2024-06-23 NOTE — Progress Notes (Signed)
 Specialty Pharmacy Refill Coordination Note  Claudia Winters is a 33 y.o. female contacted today regarding refills of specialty medication(s) Omalizumab  (XOLAIR )   Patient requested Courier to Provider Office   Delivery date: 07/01/24   Verified address: [facility] [address]   Medication will be filled on: 06/30/24

## 2024-07-09 ENCOUNTER — Ambulatory Visit

## 2024-07-17 ENCOUNTER — Ambulatory Visit (INDEPENDENT_AMBULATORY_CARE_PROVIDER_SITE_OTHER)

## 2024-07-17 DIAGNOSIS — L501 Idiopathic urticaria: Secondary | ICD-10-CM

## 2024-07-20 ENCOUNTER — Other Ambulatory Visit: Payer: Self-pay

## 2024-08-05 ENCOUNTER — Other Ambulatory Visit: Payer: Self-pay

## 2024-08-05 ENCOUNTER — Other Ambulatory Visit (HOSPITAL_COMMUNITY): Payer: Self-pay

## 2024-08-05 NOTE — Progress Notes (Signed)
 Specialty Pharmacy Refill Coordination Note  In-office administered. Patient/Guardian authorizes monthly copay charge  Claudia Winters is a 34 y.o. female contacted today regarding refills of specialty medication(s) Omalizumab  (XOLAIR )  Injection appointment: 08/14/24  Patient requested: Courier to Provider Office   Delivery date: 08/10/24   Verified address: [facility] [address] [place] [address]  Medication will be filled on 08/07/24 .

## 2024-08-07 ENCOUNTER — Other Ambulatory Visit: Payer: Self-pay

## 2024-08-14 ENCOUNTER — Ambulatory Visit

## 2024-12-03 ENCOUNTER — Ambulatory Visit: Payer: Self-pay | Admitting: Family Medicine
